# Patient Record
Sex: Male | Born: 1985 | Hispanic: Yes | Marital: Single | Smoking: Former smoker
Health system: Southern US, Community
[De-identification: ages and names within clinical notes are randomized; demographics above are authoritative.]

## PROBLEM LIST (undated history)

## (undated) DIAGNOSIS — E119 Type 2 diabetes mellitus without complications: Secondary | ICD-10-CM

---

## 2013-03-24 ENCOUNTER — Emergency Department (HOSPITAL_COMMUNITY): Payer: Self-pay

## 2013-03-24 ENCOUNTER — Encounter (HOSPITAL_COMMUNITY): Payer: Self-pay | Admitting: Emergency Medicine

## 2013-03-24 ENCOUNTER — Emergency Department (HOSPITAL_COMMUNITY)
Admission: EM | Admit: 2013-03-24 | Discharge: 2013-03-24 | Disposition: A | Discharge status: Home / Self Care | Payer: Self-pay | Attending: Emergency Medicine | Admitting: Emergency Medicine

## 2013-03-24 DIAGNOSIS — J069 Acute upper respiratory infection, unspecified: Secondary | ICD-10-CM | POA: Insufficient documentation

## 2013-03-24 DIAGNOSIS — H53149 Visual discomfort, unspecified: Secondary | ICD-10-CM | POA: Insufficient documentation

## 2013-03-24 DIAGNOSIS — R51 Headache: Secondary | ICD-10-CM

## 2013-03-24 DIAGNOSIS — R519 Headache, unspecified: Secondary | ICD-10-CM

## 2013-03-24 MED ORDER — IBUPROFEN 800 MG PO TABS
800.0000 mg | ORAL_TABLET | Freq: Once | ORAL | Status: AC
  Administered 2013-03-24: 800 mg via ORAL
  Filled 2013-03-24: qty 1

## 2013-03-24 MED ORDER — ACETAMINOPHEN 500 MG PO TABS
1000.0000 mg | ORAL_TABLET | Freq: Once | ORAL | Status: AC
  Administered 2013-03-24: 1000 mg via ORAL
  Filled 2013-03-24: qty 2

## 2013-03-24 MED ORDER — HYDROCODONE-ACETAMINOPHEN 5-325 MG PO TABS: 1.0000 | ORAL_TABLET | ORAL | Status: DC | PRN

## 2013-03-24 NOTE — Discharge Instructions (Signed)
Your temperature was mildly elevated at 100.1 to 100.6 Youras you in a you in a CT scan was negative for any stroke, bleed, tumor, oriented in no acute problems.  Please see the physicians at the adult medicine clinic here in [place] area if not improving, or return to the ED.  Please use ibuprofen every 6 hours. Please use norco for headache pain.

## 2013-03-24 NOTE — ED Notes (Signed)
Pt says headache pain is "the same". Alert, NAD at present.

## 2013-03-24 NOTE — ED Notes (Signed)
Pain rt post area of head radiating up and into rt periorbital area. For 9 days, No recent injury.  Pt says poor vision rt eye for "Years".  Rt eye diviates sl medially. Marland Kitchen.   MAE alert,

## 2013-03-24 NOTE — ED Notes (Signed)
Pt c/o headache x 10days and chills.

## 2013-03-24 NOTE — ED Provider Notes (Signed)
CSN: [id number]     Arrival date & time 03/24/13  1110 History   First MD Initiated Contact with Patient 03/24/13 1237     Chief Complaint  Patient presents with  . Headache     (Consider location/radiation/quality/duration/timing/severity/associated sxs/prior Treatment) Patient is a 28 y.o. male presenting with headaches. The history is provided by the patient.  Headache Pain location:  Occipital Radiates to: over the right eye. Severity currently:  7/10 Onset quality:  Gradual Duration:  9 days Timing:  Intermittent Progression:  Worsening Chronicity:  New Context: bright light   Context: not eating and not loud noise   Relieved by:  Nothing Worsened by:  Nothing tried Associated symptoms: photophobia   Associated symptoms: no abdominal pain, no back pain, no cough, no dizziness, no fever, no neck pain, no neck stiffness, no numbness, no paresthesias and no seizures   Risk factors: no family hx of SAH     History reviewed. No pertinent past medical history. History reviewed. No pertinent past surgical history. No family history on file. History  Substance Use Topics  . Smoking status: Never Smoker   . Smokeless tobacco: Not on file  . Alcohol Use: No    Review of Systems  Constitutional: Negative for fever and activity change.       All ROS Neg except as noted in HPI  HENT: Negative for nosebleeds.   Eyes: Positive for photophobia. Negative for discharge.  Respiratory: Negative for cough, shortness of breath and wheezing.   Cardiovascular: Negative for chest pain and palpitations.  Gastrointestinal: Negative for abdominal pain and blood in stool.  Genitourinary: Negative for dysuria, frequency and hematuria.  Musculoskeletal: Negative for arthralgias, back pain, neck pain and neck stiffness.  Skin: Negative.   Neurological: Positive for headaches. Negative for dizziness, seizures, speech difficulty, numbness and paresthesias.  Psychiatric/Behavioral: Negative for  hallucinations and confusion.      Allergies  Review of patient's allergies indicates no known allergies.  Home Medications  No current outpatient prescriptions on file. BP 136/74  Pulse 83  Temp(Src) 100.1 F (37.8 C) (Oral)  Ht 5\' 9"  (1.753 m)  Wt 212 lb (96.163 kg)  BMI 31.29 kg/m2  SpO2 97% Physical Exam  Nursing note and vitals reviewed. Constitutional: He is oriented to person, place, and time. He appears well-developed and well-nourished.  Non-toxic appearance.  HENT:  Head: Normocephalic.  Right Ear: Tympanic membrane and external ear normal.  Left Ear: Tympanic membrane and external ear normal.  No facial swelling, or redness over the sinuses.  Eyes: EOM and lids are normal. Pupils are equal, round, and reactive to light.  Neck: Normal range of motion. Neck supple. Carotid bruit is not present.  Cardiovascular: Normal rate, regular rhythm, normal heart sounds, intact distal pulses and normal pulses.   Pulmonary/Chest: Breath sounds normal. No respiratory distress.  Abdominal: Soft. Bowel sounds are normal. There is no tenderness. There is no guarding.  Musculoskeletal: Normal range of motion.  Lymphadenopathy:       Head (right side): No submandibular adenopathy present.       Head (left side): No submandibular adenopathy present.    He has no cervical adenopathy.  Neurological: He is alert and oriented to person, place, and time. He has normal strength. No cranial nerve deficit or sensory deficit. He exhibits normal muscle tone. Coordination normal.  Skin: Skin is warm and dry.  Psychiatric: He has a normal mood and affect. His speech is normal.    ED Course  Procedures (including critical care time) Labs Review Labs Reviewed - No data to display Imaging Review Ct Head Wo Contrast  03/24/2013   CLINICAL DATA:  Headache  EXAM: CT HEAD WITHOUT CONTRAST  TECHNIQUE: Contiguous axial images were obtained from the base of the skull through the vertex without  intravenous contrast.  COMPARISON:  None.  FINDINGS: No acute intracranial abnormality. Specifically, no hemorrhage, hydrocephalus, mass lesion, acute infarction, or significant intracranial injury. No acute calvarial abnormality. The visualized paranasal sinuses and mastoid air cells are patent.  IMPRESSION: No acute intracranial abnormality.   Electronically Signed   By: Salome HolmesHector  Cooper M.D.   On: 03/24/2013 13:45    EKG Interpretation   None       MDM   Final diagnoses:  None    **I have reviewed nursing notes, vital signs, and all appropriate lab and imaging results for this patient.*  Temp. 100.1. Vital signs otherwise well within normal limits. Pulse oximetry 97% on room air. CT head scan is negative for any acute changes. No gross neurologic deficits appreciated on examination.  Suspect patient has a viral illness that may be contributing to. Patient is advised to follow with his primary physician or the adult medicine clinic here in [place] area. He is to return to the emergency department if any acute changes or problems.   Kathie DikeHobson M Alverta Caccamo, PA-C 03/24/13 1436

## 2013-03-25 NOTE — ED Provider Notes (Signed)
Medical screening examination/treatment/procedure(s) were performed by non-physician practitioner and as supervising physician I was immediately available for consultation/collaboration.  EKG Interpretation   None         Nykiah Ma M Bradan Congrove, DO 03/25/13 0815 

## 2015-03-23 ENCOUNTER — Emergency Department (HOSPITAL_COMMUNITY)
Admission: EM | Admit: 2015-03-23 | Discharge: 2015-03-23 | Disposition: A | Discharge status: Home / Self Care | Payer: Self-pay | Attending: Emergency Medicine | Admitting: Emergency Medicine

## 2015-03-23 ENCOUNTER — Emergency Department (HOSPITAL_COMMUNITY): Payer: Self-pay

## 2015-03-23 ENCOUNTER — Encounter (HOSPITAL_COMMUNITY): Payer: Self-pay | Admitting: *Deleted

## 2015-03-23 DIAGNOSIS — R112 Nausea with vomiting, unspecified: Secondary | ICD-10-CM | POA: Insufficient documentation

## 2015-03-23 DIAGNOSIS — F17210 Nicotine dependence, cigarettes, uncomplicated: Secondary | ICD-10-CM | POA: Insufficient documentation

## 2015-03-23 DIAGNOSIS — R42 Dizziness and giddiness: Secondary | ICD-10-CM | POA: Insufficient documentation

## 2015-03-23 DIAGNOSIS — T59811A Toxic effect of smoke, accidental (unintentional), initial encounter: Secondary | ICD-10-CM

## 2015-03-23 DIAGNOSIS — J705 Respiratory conditions due to smoke inhalation: Secondary | ICD-10-CM | POA: Insufficient documentation

## 2015-03-23 MED ORDER — ONDANSETRON 4 MG PO TBDP
4.0000 mg | ORAL_TABLET | Freq: Once | ORAL | Status: AC
  Administered 2015-03-23: 4 mg via ORAL
  Filled 2015-03-23: qty 1
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
## 2015-03-23 NOTE — Discharge Instructions (Signed)
Lesin por la inhalacin de sustancias qumicas (Chemical Inhalation Injury) Una lesin por la inhalacin de sustancias qumicas es una lesin interna, por ejemplo, dao pulmonar, que se produce al [employer] gases de una sustancia qumica o daina (sustancia txica). Las lesiones por la inhalacin de sustancias qumicas ocurren con mayor frecuencia en estos casos:  Durante los incendios, cuando los materiales que se queman liberan sustancias qumicas en el [place].  Durante los accidentes laborales, cuando se derraman grandes cantidades de sustancias qumicas txicas en las fbricas o las industrias. Las lesiones por la inhalacin de sustancias qumicas son de diferente gravedad. Una lesin suele ser ms grave en los siguientes casos:  Cuanto ms cida o alcalina es la sustancia qumica.  Cuanto ms concentrada es la sustancia.  Cuanto ms tiempo se est expuesto a la sustancia. FACTORES DE RIESGO Tiene un riesgo alto de sufrir una lesin por la inhalacin de sustancias qumicas si:  Est expuesto a materiales que estn ardiendo.  Trabaja con sustancias qumicas, solventes o agentes de limpieza. SIGNOS Y SNTOMAS Los sntomas de una lesin por la inhalacin de sustancias qumicas pueden incluir lo siguiente:  Voz ronca.  Falta de aire o dificultad para respirar.  Dolor en el pecho.  Piel plida o de color azulado.  Produccin de mucosidad.  Tos.  Debilidad.  Mareos o [employer]. DIAGNSTICO La mayora de las lesiones por la inhalacin de sustancias txicas se puede diagnosticar con un examen fsico y Neomia Dear historia clnica. Se pueden hacer estudios para comprobar si hay dao pulmonar. Estos pueden incluir los siguientes:  Un anlisis del nivel de oxgeno en la [place].  Radiografa de trax.  Pruebas de la funcin pulmonar. No hay pruebas que permitan identificar la sustancia qumica especfica que caus la lesin. TRATAMIENTO  No hay un tratamiento especfico para una  lesin por la inhalacin de sustancias qumicas. La mayora de los tratamientos estn dirigidos a Scientist, clinical (histocompatibility and immunogenetics) la capacidad de los pulmones de llevar oxgeno al organismo. Se necesita tiempo para que el tejido pulmonar cicatrice. El tratamiento complementario puede incluir lo siguiente:  Tratamientos con [employer] para reducir la hinchazn de las vas respiratorias.  Succin de las vas respiratorias para retirar el exceso de mucosidad.  Oxgeno complementario. INSTRUCCIONES PARA EL CUIDADO EN EL HOGAR  No consuma ningn producto que contenga tabaco, lo que incluye cigarrillos, tabaco de Theatre manager o Administrator, Civil Service. Si necesita ayuda para dejar de fumar, consulte al mdico.  No se exponga a ningn producto que irrite las vas respiratorias, como el humo del cigarrillo o el humo de una chimenea.  Siga las indicaciones de su mdico en cuanto al uso de Conservation officer, nature.  Tome los medicamentos solamente como se lo haya indicado el mdico.  Concurra a todas las visitas de control como se lo haya indicado el mdico. Esto es importante. SOLICITE ATENCIN MDICA SI:  Los sntomas no mejoran como el mdico predijo. SOLICITE ATENCIN MDICA DE INMEDIATO SI:  Los sntomas empeoran.  Siente que aumentan la falta de aire o las sibilancias.  La piel o los labios estn muy plidos o de color azul.  Tiene tos persistente.  Tose con Montez Hageman o expectora un material oscuro.  Tiene dolor de pecho o debilidad.  Tiene fiebre.  Se desmaya.   Esta informacin no tiene Theme park manager el consejo del mdico. Asegrese de hacerle al mdico cualquier pregunta que tenga.   Document Released: 11/11/2013 Elsevier Interactive Patient Education [employer]
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
## 2015-11-22 ENCOUNTER — Emergency Department (HOSPITAL_COMMUNITY)
Admission: EM | Admit: 2015-11-22 | Discharge: 2015-11-22 | Disposition: A | Discharge status: Home / Self Care | Payer: Self-pay | Attending: Emergency Medicine | Admitting: Emergency Medicine

## 2015-11-22 ENCOUNTER — Encounter (HOSPITAL_COMMUNITY): Payer: Self-pay | Admitting: Emergency Medicine

## 2015-11-22 DIAGNOSIS — R300 Dysuria: Secondary | ICD-10-CM | POA: Insufficient documentation

## 2015-11-22 DIAGNOSIS — K219 Gastro-esophageal reflux disease without esophagitis: Secondary | ICD-10-CM | POA: Insufficient documentation

## 2015-11-22 DIAGNOSIS — Z87891 Personal history of nicotine dependence: Secondary | ICD-10-CM | POA: Insufficient documentation

## 2015-11-22 LAB — URINE MICROSCOPIC-ADD ON
BACTERIA UA: NONE SEEN
SQUAMOUS EPITHELIAL / LPF: NONE SEEN

## 2015-11-22 LAB — URINALYSIS, ROUTINE W REFLEX MICROSCOPIC
BILIRUBIN URINE: NEGATIVE
Glucose, UA: NEGATIVE mg/dL
Ketones, ur: NEGATIVE mg/dL
Leukocytes, UA: NEGATIVE
Nitrite: NEGATIVE
PH: 6 (ref 5.0–8.0)
SPECIFIC GRAVITY, URINE: 1.02 (ref 1.005–1.030)

## 2015-11-22 MED ORDER — AZITHROMYCIN 250 MG PO TABS
1000.0000 mg | ORAL_TABLET | Freq: Once | ORAL | Status: AC
  Administered 2015-11-22: 1000 mg via ORAL
  Filled 2015-11-22: qty 4

## 2015-11-22 MED ORDER — FAMOTIDINE 20 MG PO TABS: 20.0000 mg | ORAL_TABLET | Freq: Two times a day (BID) | ORAL | 0 refills | Status: DC

## 2015-11-22 MED ORDER — CEFTRIAXONE SODIUM 250 MG IJ SOLR
250.0000 mg | Freq: Once | INTRAMUSCULAR | Status: AC
  Administered 2015-11-22: 250 mg via INTRAMUSCULAR
  Filled 2015-11-22: qty 250
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
## 2015-11-22 NOTE — Discharge Instructions (Signed)
Call the clinic listed to establish primary care.  Return here for any worsening symptoms

## 2015-11-23 LAB — RPR: RPR Ser Ql: NONREACTIVE

## 2015-11-23 LAB — GC/CHLAMYDIA PROBE AMP (~~LOC~~) NOT AT ARMC
Chlamydia: NEGATIVE
Neisseria Gonorrhea: NEGATIVE

## 2015-11-23 LAB — HIV ANTIBODY (ROUTINE TESTING W REFLEX): HIV SCREEN 4TH GENERATION: NONREACTIVE

## 2015-11-24 ENCOUNTER — Encounter (HOSPITAL_COMMUNITY): Payer: Self-pay | Admitting: *Deleted

## 2015-11-24 ENCOUNTER — Emergency Department (HOSPITAL_COMMUNITY)
Admission: EM | Admit: 2015-11-24 | Discharge: 2015-11-24 | Disposition: A | Discharge status: Home / Self Care | Payer: Self-pay | Attending: Emergency Medicine | Admitting: Emergency Medicine

## 2015-11-24 DIAGNOSIS — R739 Hyperglycemia, unspecified: Secondary | ICD-10-CM | POA: Insufficient documentation

## 2015-11-24 DIAGNOSIS — R42 Dizziness and giddiness: Secondary | ICD-10-CM

## 2015-11-24 DIAGNOSIS — Z87891 Personal history of nicotine dependence: Secondary | ICD-10-CM | POA: Insufficient documentation

## 2015-11-24 LAB — I-STAT CHEM 8, ED
BUN: 13 mg/dL (ref 6–20)
CREATININE: 0.9 mg/dL (ref 0.61–1.24)
Calcium, Ion: 1.18 mmol/L (ref 1.15–1.40)
Chloride: 101 mmol/L (ref 101–111)
Glucose, Bld: 131 mg/dL — ABNORMAL HIGH (ref 65–99)
HCT: 48 % (ref 39.0–52.0)
Hemoglobin: 16.3 g/dL (ref 13.0–17.0)
POTASSIUM: 3.8 mmol/L (ref 3.5–5.1)
Sodium: 140 mmol/L (ref 135–145)
TCO2: 27 mmol/L (ref 0–100)

## 2015-11-24 LAB — URINALYSIS, ROUTINE W REFLEX MICROSCOPIC
Bilirubin Urine: NEGATIVE
Glucose, UA: NEGATIVE mg/dL
Ketones, ur: NEGATIVE mg/dL
Leukocytes, UA: NEGATIVE
Nitrite: NEGATIVE
Protein, ur: NEGATIVE mg/dL
SPECIFIC GRAVITY, URINE: 1.01 (ref 1.005–1.030)
pH: 6 (ref 5.0–8.0)

## 2015-11-24 LAB — URINE CULTURE: Culture: NO GROWTH

## 2015-11-24 LAB — URINE MICROSCOPIC-ADD ON
BACTERIA UA: NONE SEEN
RBC / HPF: NONE SEEN RBC/hpf (ref 0–5)
SQUAMOUS EPITHELIAL / LPF: NONE SEEN
WBC, UA: NONE SEEN WBC/hpf (ref 0–5)

## 2015-11-24 NOTE — ED Provider Notes (Signed)
[facility]-EMERGENCY DEPT Provider Note   CSN: [id number] Arrival date & time: 11/24/15  1357     History   Chief Complaint Chief Complaint  Patient presents with  . Dizziness    HPI Bhavya Grand is a 30 y.o. male.complaining vertigo for 3 weeks began gradually and occurs constantly.  Worsens with head movements.  States also more tired than usual and thinks he needs some vitamins.  HPI none History reviewed. No pertinent past medical history.  There are no active problems to display for this patient.   History reviewed. No pertinent surgical history.     Home Medications    Prior to Admission medications   Medication Sig Start Date End Date Taking? Authorizing Provider  famotidine (PEPCID) 20 MG tablet Take 1 tablet (20 mg total) by mouth 2 (two) times daily. 11/22/15   Tammy Triplett, PA-C  HYDROcodone-acetaminophen (NORCO) 5-325 MG per tablet Take 1 tablet by mouth every 4 (four) hours as needed for moderate pain. 03/24/13   Ivery Quale, PA-C    Family History No family history on file.  Social History Social History  Substance Use Topics  . Smoking status: Former Games developer  . Smokeless tobacco: Never Used  . Alcohol use No     Allergies   Review of patient's allergies indicates no known allergies.   Review of Systems Review of Systems  Constitutional: Positive for fatigue. Negative for appetite change, chills, diaphoresis and unexpected weight change.  HENT: Positive for hearing loss. Negative for congestion, dental problem, ear discharge, ear pain, facial swelling, mouth sores, nosebleeds, postnasal drip, rhinorrhea, sinus pressure, sneezing, sore throat, tinnitus, trouble swallowing and voice change.        Ears get stopped up occasionally  Eyes: Negative.   Respiratory: Negative.   Cardiovascular: Negative.   Gastrointestinal: Negative.   Endocrine: Positive for polydipsia and polyuria.  Genitourinary: Positive for frequency. Negative for decreased  urine volume, discharge, flank pain, hematuria, penile pain, penile swelling, scrotal swelling, testicular pain and urgency.  Musculoskeletal: Positive for back pain. Negative for gait problem, joint swelling, myalgias, neck pain and neck stiffness.  Skin: Negative.   Allergic/Immunologic: Negative.   Neurological: Negative.   Hematological: Negative.   Psychiatric/Behavioral: Negative.   All other systems reviewed and are negative.    Physical Exam Updated Vital Signs BP 137/75 (BP Location: Left Arm)   Pulse 78   Temp 98.7 F (37.1 C) (Temporal)   Resp 16   Wt 101.6 kg   SpO2 99%   BMI 33.08 kg/m   Physical Exam  Constitutional: He is oriented to person, place, and time. He appears well-developed and well-nourished.  HENT:  Head: Normocephalic and atraumatic.  Right Ear: External ear normal.  Left Ear: External ear normal.  Nose: Nose normal.  Mouth/Throat: Oropharynx is clear and moist.  Eyes: Conjunctivae and EOM are normal. Pupils are equal, round, and reactive to light.  Disconjugate gaze-baseline per patient  Neck: Normal range of motion. Neck supple.  Cardiovascular: Normal rate, regular rhythm, normal heart sounds and intact distal pulses.   Pulmonary/Chest: Effort normal and breath sounds normal. No respiratory distress. He has no wheezes. He exhibits no tenderness.  Abdominal: Soft. Bowel sounds are normal. He exhibits no distension and no mass. There is no tenderness. There is no guarding.  Musculoskeletal: Normal range of motion.  Neurological: He is alert and oriented to person, place, and time. He has normal reflexes. He displays normal reflexes. No cranial nerve deficit. He exhibits normal muscle tone.  Coordination normal.  Skin: Skin is warm and dry. Capillary refill takes less than 2 seconds.  Psychiatric: He has a normal mood and affect. His behavior is normal. Judgment and thought content normal.  Nursing note and vitals reviewed.    ED Treatments /  Results  Labs (all labs ordered are listed, but only abnormal results are displayed) Labs Reviewed  URINALYSIS, ROUTINE W REFLEX MICROSCOPIC (NOT AT [facility]) - Abnormal; Notable for the following:       Result Value   Hgb urine dipstick TRACE (*)    All other components within normal limits  I-STAT CHEM 8, ED - Abnormal; Notable for the following:    Glucose, Bld 131 (*)    All other components within normal limits  URINE MICROSCOPIC-ADD ON    EKG  EKG Interpretation None       Radiology No results found.  Procedures Procedures (including critical care time)  Medications Ordered in ED Medications - No data to display   Initial Impression / Assessment and Plan / ED Course  I have reviewed the triage vital signs and the nursing notes.  Pertinent labs & imaging results that were available during my care of the patient were reviewed by me and considered in my medical decision making (see chart for details).  Clinical Course    Patient stable here.  Exam including neuro normal.  Blood sugar elevated at 130.  Discussed need for followup with patient and voices understanding.   Final Clinical Impressions(s) / ED Diagnoses   Final diagnoses:  Dizziness  Hyperglycemia    New Prescriptions New Prescriptions   No medications on file     Margarita Grizzleanielle Katielynn Horan, MD 11/24/15 1649

## 2015-11-24 NOTE — ED Triage Notes (Signed)
C/o dizziness, lack of energy seen  2 days ago for same, states he is worse

## 2015-11-25 NOTE — ED Provider Notes (Signed)
[facility]-EMERGENCY DEPT Provider Note   CSN: [id number] Arrival date & time: 11/22/15  1130     History   Chief Complaint Chief Complaint  Patient presents with  . Dysuria    HPI Luis Becker is a 30 y.o. male.  HPI  Luis Becker is a 30 y.o. male who presents to the Emergency Department complaining of burning with urination for 2 weeks.  He describes the burning as intermittent.  States that he is currently not sexually active.  He also complains of burning pain to his chest and abdominal bloating both associated with food intake.  He denies abdominal pain, vomiting, shortness of breath, genital lesions or penile discharge.   History reviewed. No pertinent past medical history.  There are no active problems to display for this patient.   History reviewed. No pertinent surgical history.     Home Medications    Prior to Admission medications   Medication Sig Start Date End Date Taking? Authorizing Provider  famotidine (PEPCID) 20 MG tablet Take 1 tablet (20 mg total) by mouth 2 (two) times daily. 11/22/15   Santita Hunsberger, PA-C    Family History History reviewed. No pertinent family history.  Social History Social History  Substance Use Topics  . Smoking status: Former Games developer  . Smokeless tobacco: Never Used  . Alcohol use No     Allergies   Review of patient's allergies indicates no known allergies.   Review of Systems Review of Systems  Constitutional: Negative for activity change, appetite change, chills and fever.  Respiratory: Negative for chest tightness and shortness of breath.   Gastrointestinal: Negative for abdominal pain, nausea and vomiting.       Abdominal bloating  Genitourinary: Positive for dysuria. Negative for decreased urine volume, difficulty urinating, discharge, flank pain, frequency, genital sores, hematuria, penile swelling, scrotal swelling, testicular pain and urgency.  Musculoskeletal: Negative for back pain.  Skin: Negative for  rash.  Neurological: Negative for dizziness, weakness and numbness.  Hematological: Negative for adenopathy.  Psychiatric/Behavioral: Negative for confusion.  All other systems reviewed and are negative.    Physical Exam Updated Vital Signs BP 133/88 (BP Location: Left Arm)   Pulse 88   Temp 98.6 F (37 C) (Oral)   Resp 18   Ht 5\' 9"  (1.753 m)   Wt 90.7 kg   SpO2 98%   BMI 29.53 kg/m   Physical Exam  Constitutional: He is oriented to person, place, and time. He appears well-developed and well-nourished. No distress.  HENT:  Head: Normocephalic and atraumatic.  Mouth/Throat: Oropharynx is clear and moist.  Cardiovascular: Normal rate, regular rhythm, normal heart sounds and intact distal pulses.   No murmur heard. Pulmonary/Chest: Effort normal and breath sounds normal. No respiratory distress. He has no wheezes. He has no rales.  Abdominal: Soft. Normal appearance. He exhibits no distension and no mass. There is no hepatosplenomegaly. There is no tenderness. There is no rigidity, no rebound, no guarding, no CVA tenderness and no tenderness at McBurney's point. Hernia confirmed negative in the right inguinal area and confirmed negative in the left inguinal area.  Genitourinary: Cremasteric reflex is present. Right testis shows no mass and no tenderness. Left testis shows no mass and no tenderness. Uncircumcised. No phimosis, paraphimosis, penile erythema or penile tenderness. No discharge found.  Genitourinary Comments: Exam chaperoned by nursing staff. No penile discharge, genital lesions or balanitis.  Musculoskeletal: Normal range of motion. He exhibits no edema.  Neurological: He is alert and oriented to person, place,  and time. Coordination normal.  Skin: Skin is warm and dry. No rash noted.  Nursing note and vitals reviewed.    ED Treatments / Results  Labs (all labs ordered are listed, but only abnormal results are displayed) Labs Reviewed  URINALYSIS, ROUTINE W  REFLEX MICROSCOPIC (NOT AT [facility]) - Abnormal; Notable for the following:       Result Value   Hgb urine dipstick TRACE (*)    Protein, ur TRACE (*)    All other components within normal limits  URINE CULTURE  RPR  HIV ANTIBODY (ROUTINE TESTING)  URINE MICROSCOPIC-ADD ON  GC/CHLAMYDIA PROBE AMP ([place]) NOT AT [facility]    EKG  EKG Interpretation None       Radiology No results found.  Procedures Procedures (including critical care time)  Medications Ordered in ED Medications  cefTRIAXone (ROCEPHIN) injection 250 mg (250 mg Intramuscular Given 11/22/15 1356)  azithromycin (ZITHROMAX) tablet 1,000 mg (1,000 mg Oral Given 11/22/15 1357)     Initial Impression / Assessment and Plan / ED Course  I have reviewed the triage vital signs and the nursing notes.  Pertinent labs & imaging results that were available during my care of the patient were reviewed by me and considered in my medical decision making (see chart for details).  Clinical Course    Pt well appearing.  Vitals stable.  abd soft, NT.    IM rocephin and zithromax given, discussed possible GERD and PMD f/u.  Referral info given.  The patient appears reasonably screened and/or stabilized for discharge and I doubt any other medical condition or other [facility] requiring further screening, evaluation, or treatment in the ED at this time prior to discharge.   Final Clinical Impressions(s) / ED Diagnoses   Final diagnoses:  Dysuria  Gastroesophageal reflux disease without esophagitis    New Prescriptions Discharge Medication List as of 11/22/2015  2:05 PM    START taking these medications   Details  famotidine (PEPCID) 20 MG tablet Take 1 tablet (20 mg total) by mouth 2 (two) times daily., Starting Thu 11/22/2015, Print         Pauline Ausammy Sandria Mcenroe, PA-C 11/25/15 1133    Marily MemosJason Mesner, MD 11/26/15 [phone]

## 2016-01-22 ENCOUNTER — Encounter (HOSPITAL_COMMUNITY): Payer: Self-pay | Admitting: Emergency Medicine

## 2016-01-22 ENCOUNTER — Emergency Department (HOSPITAL_COMMUNITY)
Admission: EM | Admit: 2016-01-22 | Discharge: 2016-01-22 | Disposition: A | Discharge status: Home / Self Care | Payer: Self-pay | Attending: Emergency Medicine | Admitting: Emergency Medicine

## 2016-01-22 DIAGNOSIS — E119 Type 2 diabetes mellitus without complications: Secondary | ICD-10-CM | POA: Insufficient documentation

## 2016-01-22 DIAGNOSIS — Z87891 Personal history of nicotine dependence: Secondary | ICD-10-CM | POA: Insufficient documentation

## 2016-01-22 DIAGNOSIS — R002 Palpitations: Secondary | ICD-10-CM | POA: Insufficient documentation

## 2016-01-22 DIAGNOSIS — R631 Polydipsia: Secondary | ICD-10-CM | POA: Insufficient documentation

## 2016-01-22 HISTORY — DX: Type 2 diabetes mellitus without complications: E11.9

## 2016-01-22 LAB — I-STAT CHEM 8, ED
BUN: 35 mg/dL — ABNORMAL HIGH (ref 6–20)
Calcium, Ion: 1.03 mmol/L — ABNORMAL LOW (ref 1.15–1.40)
Chloride: 106 mmol/L (ref 101–111)
Creatinine, Ser: 1 mg/dL (ref 0.61–1.24)
Glucose, Bld: 101 mg/dL — ABNORMAL HIGH (ref 65–99)
HCT: 47 % (ref 39.0–52.0)
Hemoglobin: 16 g/dL (ref 13.0–17.0)
Potassium: 4.3 mmol/L (ref 3.5–5.1)
Sodium: 140 mmol/L (ref 135–145)
TCO2: 27 mmol/L (ref 0–100)

## 2016-01-22 NOTE — ED Triage Notes (Signed)
Pt reports feeling like his heart "is beating too fast." Pt states his diabetic medicine has been decreased.

## 2016-01-22 NOTE — ED Provider Notes (Signed)
[facility] Provider Note   CSN: [id number] Arrival date & time: 01/22/16  1646 By signing my name below, I, Luis Becker, attest that this documentation has been prepared under the direction and in the presence of Luis RazorStephen Josedaniel Haye, MD. Electronically Signed: Bridgette HabermannMaria Becker, ED Scribe. 01/22/16. 5:40 PM.  History   Chief Complaint Chief Complaint  Patient presents with  . Palpitations   HPI Comments: Luis BeltRoberto Becker is a 30 y.o. male with h/o DM who presents to the Emergency Department complaining of intermittent sensation of heart palpations onset 4 days ago. Pt states these episodes last about 1-2 minutes. He also notes that he has been increasingly thirsty lately. Pt reports that he is regularly on Metformin for his diabetes and his prescription has recently been decreased to one pill a day. He is concerned that this is what may be causing his symptoms. Pt drank a bottle of water and notes that it relieved his symptoms. Pt does not drink alcohol and does not drink caffeine. Denies drug use. Pt further denies chest pain, fever, chills, urinary frequency, or any other associated symptoms.  The history is provided by the patient. No language interpreter was used.    Past Medical History:  Diagnosis Date  . Diabetes mellitus without complication (HCC)    per pt pre diabetic    There are no active problems to display for this patient.   History reviewed. No pertinent surgical history.     Home Medications    Prior to Admission medications   Medication Sig Start Date End Date Taking? Authorizing Provider  famotidine (PEPCID) 20 MG tablet Take 1 tablet (20 mg total) by mouth 2 (two) times daily. 11/22/15   Tammy Triplett, PA-C    Family History History reviewed. No pertinent family history.  Social History Social History  Substance Use Topics  . Smoking status: Former Games developermoker  . Smokeless tobacco: Never Used  . Alcohol use No     Allergies   Patient has no known  allergies.   Review of Systems Review of Systems  Constitutional: Negative for chills and fever.  Cardiovascular: Positive for palpitations. Negative for chest pain.  Endocrine: Positive for polydipsia.  Genitourinary: Negative for frequency.  All other systems reviewed and are negative.    Physical Exam Updated Vital Signs BP 120/67 (BP Location: Right Arm)   Pulse 82   Temp 98.3 F (36.8 C) (Oral)   Resp 19   Ht 5' 9.29" (1.76 m)   Wt 203 lb (92.1 kg)   SpO2 100%   BMI 29.73 kg/m   Physical Exam  Constitutional: He is oriented to person, place, and time. Vital signs are normal. He appears well-developed and well-nourished.  Non-toxic appearance. He does not appear ill. No distress.  HENT:  Head: Normocephalic and atraumatic.  Nose: Nose normal.  Mouth/Throat: Oropharynx is clear and moist. No oropharyngeal exudate.  Eyes: Conjunctivae and EOM are normal. Pupils are equal, round, and reactive to light. No scleral icterus.  Neck: Normal range of motion. Neck supple. No tracheal deviation, no edema, no erythema and normal range of motion present. No thyroid mass and no thyromegaly present.  Cardiovascular: Normal rate, regular rhythm, S1 normal, S2 normal, normal heart sounds, intact distal pulses and normal pulses.  Exam reveals no gallop and no friction rub.   No murmur heard. Pulmonary/Chest: Effort normal and breath sounds normal. No respiratory distress. He has no wheezes. He has no rhonchi. He has no rales.  Abdominal: Soft. Normal appearance and  bowel sounds are normal. He exhibits no distension, no ascites and no mass. There is no hepatosplenomegaly. There is no tenderness. There is no rebound, no guarding and no CVA tenderness.  Musculoskeletal: Normal range of motion. He exhibits no edema or tenderness.  Lymphadenopathy:    He has no cervical adenopathy.  Neurological: He is alert and oriented to person, place, and time. He has normal strength. No cranial nerve  deficit or sensory deficit.  Skin: Skin is warm, dry and intact. No petechiae and no rash noted. He is not diaphoretic. No erythema. No pallor.  Nursing note and vitals reviewed.    ED Treatments / Results  DIAGNOSTIC STUDIES: Oxygen Saturation is 100% on RA, normal by my interpretation.    COORDINATION OF CARE: 5:32 PM Discussed treatment plan with pt at bedside which includes blood work and pt agreed to plan.  Labs (all labs ordered are listed, but only abnormal results are displayed) Labs Reviewed - No data to display  EKG  EKG Interpretation None       Radiology No results found.  Procedures Procedures (including critical care time)  Medications Ordered in ED Medications - No data to display   Initial Impression / Assessment and Plan / ED Course  I have reviewed the triage vital signs and the nursing notes.  Pertinent labs & imaging results that were available during my care of the patient were reviewed by me and considered in my medical decision making (see chart for details).  Clinical Course     30 year old male with palpitations.He remained in a sinus rhythm on the monitor. EKG is not concerning. Basic labs unremarkable. Doubt emergent condition.  Final Clinical Impressions(s) / ED Diagnoses   Final diagnoses:  Palpitations    New Prescriptions New Prescriptions   No medications on file   I personally preformed the services scribed in my presence. The recorded information has been reviewed is accurate. Luis RazorStephen Talis Iwan, MD.     Luis RazorStephen Harshil Cavallaro, MD 01/22/16 [phone]

## 2016-01-22 NOTE — ED Notes (Addendum)
Attempted to obtain I-stat Chem 8. Site infiltrated before blood sample could be obtained. Consulting civil engineerCharge RN and primary RN aware.

## 2017-10-07 ENCOUNTER — Encounter (HOSPITAL_COMMUNITY): Payer: Self-pay

## 2017-10-07 ENCOUNTER — Emergency Department (HOSPITAL_COMMUNITY): Payer: Self-pay

## 2017-10-07 ENCOUNTER — Other Ambulatory Visit: Payer: Self-pay

## 2017-10-07 ENCOUNTER — Emergency Department (HOSPITAL_COMMUNITY)
Admission: EM | Admit: 2017-10-07 | Discharge: 2017-10-07 | Disposition: A | Discharge status: Home / Self Care | Payer: Self-pay | Attending: Emergency Medicine | Admitting: Emergency Medicine

## 2017-10-07 DIAGNOSIS — E119 Type 2 diabetes mellitus without complications: Secondary | ICD-10-CM | POA: Insufficient documentation

## 2017-10-07 DIAGNOSIS — M5431 Sciatica, right side: Secondary | ICD-10-CM | POA: Insufficient documentation

## 2017-10-07 DIAGNOSIS — Z87891 Personal history of nicotine dependence: Secondary | ICD-10-CM | POA: Insufficient documentation

## 2017-10-07 MED ORDER — CYCLOBENZAPRINE HCL 5 MG PO TABS: 5.0000 mg | ORAL_TABLET | Freq: Three times a day (TID) | ORAL | 0 refills | Status: DC | PRN

## 2017-10-07 MED ORDER — IBUPROFEN 800 MG PO TABS: 800.0000 mg | ORAL_TABLET | Freq: Three times a day (TID) | ORAL | 0 refills | Status: DC

## 2017-10-07 NOTE — ED Notes (Signed)
Registration in room with patient.

## 2017-10-07 NOTE — ED Provider Notes (Signed)
[facility] EMERGENCY DEPARTMENT Provider Note   CSN: [id number] Arrival date & time: 10/07/17  1343     History   Chief Complaint Chief Complaint  Patient presents with  . Back Pain    HPI Luis Becker is a 32 y.o. male with no significant past problems with his lower back presenting with a 4 day history of low back pain with intermittent radiating burning pain to his lateral right mid thigh. He denies injury. 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  He denies weakness in his legs or feet and denies urinary or fecal incontinence or retention, no dysuria and no flank pain.  He also denies fevers, chills, rash.  He has taken naproxen without significant improvement in his symptoms. Pain is worsen with movement, especially with standing from a seated position.  The history is provided by the patient.    Past Medical History:  Diagnosis Date  . Diabetes mellitus without complication (HCC)    per pt pre diabetic    There are no active problems to display for this patient.   History reviewed. No pertinent surgical history.      Home Medications    Prior to Admission medications   Medication Sig Start Date End Date Taking? Authorizing Provider  cyclobenzaprine (FLEXERIL) 5 MG tablet Take 1 tablet (5 mg total) by mouth 3 (three) times daily as needed for muscle spasms. 10/07/17   Burgess Amor, PA-C  famotidine (PEPCID) 20 MG tablet Take 1 tablet (20 mg total) by mouth 2 (two) times daily. Patient not taking: Reported on 01/22/2016 11/22/15   Triplett, Babette Relic, PA-C  ibuprofen (ADVIL,MOTRIN) 800 MG tablet Take 1 tablet (800 mg total) by mouth 3 (three) times daily. 10/07/17   Burgess Amor, PA-C    Family History No family history on file.  Social History Social History   Tobacco Use  . Smoking status: Former Games developer  . Smokeless tobacco: Never Used  Substance Use Topics  . Alcohol use: No  . Drug use: No      Allergies   Patient has no known allergies.   Review of Systems Review of Systems  Constitutional: Negative for fever.  Respiratory: Negative for shortness of breath.   Cardiovascular: Negative for chest pain and leg swelling.  Gastrointestinal: Negative for abdominal distention, abdominal pain and constipation.  Genitourinary: Negative for difficulty urinating, dysuria, flank pain, frequency and urgency.  Musculoskeletal: Positive for back pain. Negative for gait problem and joint swelling.  Skin: Negative for rash.  Neurological: Negative for weakness and numbness.     Physical Exam Updated Vital Signs BP 132/79 (BP Location: Left Arm)   Pulse 68   Temp 100 F (37.8 C) (Oral)   Resp 16   Wt 101.6 kg   SpO2 96%   BMI 32.80 kg/m   Physical Exam  Constitutional: He appears well-developed and well-nourished.  HENT:  Head: Normocephalic.  Eyes: Conjunctivae are normal.  Neck: Normal range of motion. Neck supple.  Cardiovascular: Normal rate and intact distal pulses.  Pedal pulses normal.  Pulmonary/Chest: Effort normal.  Abdominal: Soft. Bowel sounds are normal. He exhibits no distension and no mass.  Musculoskeletal: Normal range of motion. He exhibits no edema.       Lumbar back: He exhibits tenderness. He exhibits no bony tenderness, no swelling, no edema and no spasm.  ttp right paralumbar soft tissue. No midline pain or edema.  Neurological: He is alert. He  has normal strength. He displays no atrophy and no tremor. No sensory deficit. Gait normal.  Reflex Scores:      Patellar reflexes are 2+ on the right side and 2+ on the left side. No strength deficit noted in hip and knee flexor and extensor muscle groups.  Ankle flexion and extension intact. Gait normal.  Skin: Skin is warm and dry.  Psychiatric: He has a normal mood and affect.  Nursing note and vitals reviewed.    ED Treatments / Results  Labs (all labs ordered are listed, but only abnormal  results are displayed) Labs Reviewed - No data to display  EKG None  Radiology Dg Lumbar Spine Complete  Result Date: 10/07/2017 CLINICAL DATA:  Low back pain for the past several days. EXAM: LUMBAR SPINE - COMPLETE 4+ VIEW COMPARISON:  None. FINDINGS: There are 5 non rib-bearing lumbar type vertebral bodies. There is a mild (under 5 degrees) scoliotic curvature of the thoracolumbar spine with dominant caudal component convex the left, potentially positional. There is minimal straightening of the expected lumbar lordosis. No anterolisthesis or retrolisthesis. No definite pars defects. Lumbar vertebral body heights appear preserved. Lumbar intervertebral disc space heights appear preserved. Limited visualization of the bilateral SI joints and hips is normal. Regional soft tissues are normal. IMPRESSION: Minimal straightening of the expected lumbar lordosis, nonspecific though could be seen in the setting of muscle spasm. Otherwise, no explanation for patient's low back pain. Electronically Signed   By: Simonne ComeJohn  Watts M.D.   On: 10/07/2017 15:02    Procedures Procedures (including critical care time)  Medications Ordered in ED Medications - No data to display   Initial Impression / Assessment and Plan / ED Course  I have reviewed the triage vital signs and the nursing notes.  Pertinent labs & imaging results that were available during my care of the patient were reviewed by me and considered in my medical decision making (see chart for details).     Imaging reviewed and discussed. Suspect muscle strain/spasm as source of sx.  Discussed heat and ice tx, advised activities as tolerated, flexeril and ibuprofen prescribed. Referral to several clinics for pcp f/u, return precautions outlined.  Final Clinical Impressions(s) / ED Diagnoses   Final diagnoses:  Sciatica of right side    ED Discharge Orders         Ordered    cyclobenzaprine (FLEXERIL) 5 MG tablet  3 times daily PRN      10/07/17 1552    ibuprofen (ADVIL,MOTRIN) 800 MG tablet  3 times daily     10/07/17 1552           Burgess Amordol, Moises Terpstra, PA-C 10/07/17 2102    Mesner, Barbara CowerJason, MD 10/09/17 2359

## 2017-10-07 NOTE — ED Notes (Signed)
Patient reports lower lumbar pain that increases with bending, twisting, or lifting. No acute injury. 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 Occas. radiation to L thigh. No problems with bowel or bladder.

## 2017-10-07 NOTE — ED Triage Notes (Signed)
Pt states lower back pain for the last few days. Upon trying to get out of bed today he felt more intense pain. Is ambulatory. NAD

## 2017-10-07 NOTE — Discharge Instructions (Signed)
Take your next dose of prednisone tomorrow morning.  Use the the other medicines as directed.  Do not drive within 4 hours of taking flexeril as this can make you drowsy.  Avoid lifting,  Bending,  Twisting or any other activity that worsens your pain over the next week.  Apply a heating pad to your lower back for 20 minutes 3-4 times daily.  You should get rechecked if your symptoms are not better over the next 5 days,  Or you develop increased pain,  Weakness in your leg(s) or loss of bladder or bowel function - these are symptoms of a worse injury.  Your xrays suggest that you may have muscle spasm in your lower back as the cause of your symptoms.  Bend your legs at the hips when sitting and lying down to take the pressure off your lower back as much as possible as we discussed.  Avoid any activities that worsens your pain until resolved.

## 2018-01-23 ENCOUNTER — Emergency Department (HOSPITAL_COMMUNITY)
Admission: EM | Admit: 2018-01-23 | Discharge: 2018-01-23 | Disposition: A | Discharge status: Home / Self Care | Payer: Self-pay | Attending: Emergency Medicine | Admitting: Emergency Medicine

## 2018-01-23 ENCOUNTER — Emergency Department (HOSPITAL_COMMUNITY): Payer: Self-pay

## 2018-01-23 ENCOUNTER — Encounter (HOSPITAL_COMMUNITY): Payer: Self-pay | Admitting: Emergency Medicine

## 2018-01-23 DIAGNOSIS — E119 Type 2 diabetes mellitus without complications: Secondary | ICD-10-CM | POA: Insufficient documentation

## 2018-01-23 DIAGNOSIS — M79675 Pain in left toe(s): Secondary | ICD-10-CM | POA: Insufficient documentation

## 2018-01-23 DIAGNOSIS — Z87891 Personal history of nicotine dependence: Secondary | ICD-10-CM | POA: Insufficient documentation

## 2018-01-23 MED ORDER — NAPROXEN 500 MG PO TABS: 500.0000 mg | ORAL_TABLET | Freq: Two times a day (BID) | ORAL | 0 refills | Status: AC

## 2018-01-23 NOTE — ED Provider Notes (Signed)
[facility] EMERGENCY DEPARTMENT Provider Note   CSN: [id number] Arrival date & time: 01/23/18  [id number]     History   Chief Complaint Chief Complaint  Patient presents with  . Foot Pain    HPI Luis Becker is a 32 y.o. male with no significant past medical history who presents for evaluation of left foot pain. Pain began 1 week ago. Pain is intermittent in nature. Pain is located to the lateral surface at the base of the great toe. Has been taking Naproxen with mild relief of symptoms. Last dose of Naproxen yesterday at 7pm. States only taking "1 dose" of naproxen once daily for symptom relief over the last week. Denies radiation of pain. Denies injury or trauma. Rates his pain a 4/10 currently, 7/10 at it worse. States nothing makes pain worse. Rest relieves pain. Denies fever, chills, N/V, swelling, ecchymosis, redness, warmth, numbness/tingling in extremities, weakness. Denies additional aggravating or alleviating factors other than stated above. Has been able to walk without difficulty. State he has had a similar episode previously around 5 years ago. States he was given an injection into his toe which resolved the pain. Denies IVDU, immunocompromised state.  History obtained from patient. No interpretor was used.  HPI  Past Medical History:  Diagnosis Date  . Diabetes mellitus without complication (HCC)    per pt pre diabetic    There are no active problems to display for this patient.   History reviewed. No pertinent surgical history.      Home Medications    Prior to Admission medications   Medication Sig Start Date End Date Taking? Authorizing Provider  cyclobenzaprine (FLEXERIL) 5 MG tablet Take 1 tablet (5 mg total) by mouth 3 (three) times daily as needed for muscle spasms. 10/07/17   Burgess AmorIdol, Julie, PA-C  famotidine (PEPCID) 20 MG tablet Take 1 tablet (20 mg total) by mouth 2 (two) times daily. Patient not taking: Reported on 01/22/2016 11/22/15   Triplett, Babette Relicammy,  PA-C  ibuprofen (ADVIL,MOTRIN) 800 MG tablet Take 1 tablet (800 mg total) by mouth 3 (three) times daily. 10/07/17   Burgess AmorIdol, Julie, PA-C  naproxen (NAPROSYN) 500 MG tablet Take 1 tablet (500 mg total) by mouth 2 (two) times daily for 7 days. 01/23/18 01/30/18  Rasheen Bells A, PA-C    Family History History reviewed. No pertinent family history.  Social History Social History   Tobacco Use  . Smoking status: Former Games developermoker  . Smokeless tobacco: Never Used  Substance Use Topics  . Alcohol use: No  . Drug use: No     Allergies   Patient has no known allergies.   Review of Systems Review of Systems  Constitutional: Negative.   Respiratory: Negative for cough and shortness of breath.   Cardiovascular: Negative for chest pain and leg swelling.  Gastrointestinal: Negative for nausea and vomiting.  Musculoskeletal: Negative for arthralgias, back pain, gait problem, joint swelling, myalgias, neck pain and neck stiffness.       Left great toe pain  Skin: Negative.   Neurological: Negative for dizziness, weakness, light-headedness, numbness and headaches.  All other systems reviewed and are negative.    Physical Exam Updated Vital Signs BP 136/89 (BP Location: Right Arm)   Pulse 71   Temp 99.2 F (37.3 C) (Oral)   Resp 16   Ht 5' 8.9" (1.75 m)   Wt 101 kg   SpO2 97%   BMI 32.98 kg/m   Physical Exam Vitals signs and nursing note reviewed.  Constitutional:  General: He is not in acute distress.    Appearance: Normal appearance. He is well-developed. He is not ill-appearing, toxic-appearing or diaphoretic.  HENT:     Head: Atraumatic.  Eyes:     Pupils: Pupils are equal, round, and reactive to light.  Neck:     Musculoskeletal: Normal range of motion and neck supple.  Cardiovascular:     Rate and Rhythm: Normal rate and regular rhythm.     Pulses: Normal pulses.          Dorsalis pedis pulses are 2+ on the right side and 2+ on the left side.     Heart sounds:  Normal heart sounds.  Pulmonary:     Effort: Pulmonary effort is normal. No respiratory distress.     Breath sounds: Normal breath sounds. No stridor or decreased air movement. No decreased breath sounds, wheezing, rhonchi or rales.  Abdominal:     General: There is no distension.     Palpations: Abdomen is soft.  Musculoskeletal: Normal range of motion.     Right ankle: Normal.     Left ankle: Normal.     Right foot: Normal range of motion. No deformity, bunion or foot drop.     Left foot: Normal range of motion. No deformity, bunion or foot drop.     Comments: Patient able to ambulate without difficulty and without ataxic gait.  Mild tenderness palpation over left great toe.  No erythema or warmth.  There is no swelling.  No streaking.  No bony tenderness.  Able to flex and extend at metatarsals as well as plantarflex and dorsiflex at ankle.  Lower extremity compartments are soft. No calf tenderness, swelling or erythema. Full range of motion bilateral lower extremities. No midline back pain.  Feet:     Right foot:     Skin integrity: Skin integrity normal.     Toenail Condition: Right toenails are normal.     Left foot:     Skin integrity: Skin integrity normal.     Toenail Condition: Left toenails are normal.  Skin:    General: Skin is warm and dry.     Coloration: Skin is not jaundiced, mottled or pale.     Findings: No abrasion, abscess, bruising, burn, ecchymosis, erythema, signs of injury, laceration, lesion, rash or wound.     Comments: No rashes or lesions.  No ecchymosis.  No erythema, warmth.    Neurological:     General: No focal deficit present.     Mental Status: He is alert.     Sensory: Sensation is intact. No sensory deficit.     Motor: Motor function is intact. No weakness, tremor or abnormal muscle tone.     Gait: Gait is intact. Gait normal.     Comments: Intact sensation to sharp and dull bilateral lower extremity. 5/5 strength to bilateral lower extremities.         ED Treatments / Results  Labs (all labs ordered are listed, but only abnormal results are displayed) Labs Reviewed - No data to display  EKG None  Radiology Dg Foot Complete Left  Result Date: 01/23/2018 CLINICAL DATA:  Acute left foot pain without known injury. EXAM: LEFT FOOT - COMPLETE 3+ VIEW COMPARISON:  None. FINDINGS: There is no evidence of fracture or dislocation. There is no evidence of arthropathy or other focal bone abnormality. Soft tissues are unremarkable. IMPRESSION: Negative. Electronically Signed   By: Lupita Raider, M.D.   On: 01/23/2018 09:21  Procedures Procedures (including critical care time)  Medications Ordered in ED Medications - No data to display   Initial Impression / Assessment and Plan / ED Course  I have reviewed the triage vital signs and the nursing notes.  Pertinent labs & imaging results that were available during my care of the patient were reviewed by me and considered in my medical decision making (see chart for details).  32 year old male who appears otherwise well presents for evaluation of left great toe pain.  Onset 1 week ago.  Has taken naproxen with mild relief of symptoms for "1 dose."  Mild tenderness to palpation to left great toe.  No erythema, warmth, or edema.  No evidence of cellulitis.  Normal musculoskeletal exam.  Neurovascularly intact.  He is able to ambulate without difficulty.  Has had similar episode approximately 4 years ago and was given a steroid injection and his symptoms resolved.  Patient does not recall whether he was ever diagnosed with gout previously. Denies IV drug use or immunocompromised state. Denies trauma or recent injury.  Afebrile, nonseptic, non-ill-appearing. Will obtain plain film to r/o fracture or dislocation and reevaluate.  Plain film negative for fracture, dislocation, soft tissue swelling.  Low suspicion for septic joint, hemarthrosis.  Gout vs possible musculoskeletal injury however  no overlying swelling, erythema, warmth.  No evidence of ingrown toenail.  Discussed naproxen as well as ice, elevation and follow-up with PCP for reevaluation.  Patient was given resources to establish care with provider.  Low suspicion for emergent pathology causing patient's symptoms at this time.  Patient is hemodynamically stable and appropriate for DC home at this time.  Discussed strict return precautions.  Patient voiced understanding and is agreeable for follow-up.    Final Clinical Impressions(s) / ED Diagnoses   Final diagnoses:  Pain of toe of left foot    ED Discharge Orders         Ordered    naproxen (NAPROSYN) 500 MG tablet  2 times daily     01/23/18 0943           Aubery Douthat A, PA-C 01/23/18 1610    Vanetta Mulders, MD 01/27/18 1056

## 2018-01-23 NOTE — ED Triage Notes (Signed)
Pt states his left foot has been hurting for a week with no injury.  Has been taking Naproxen for pain with some relief.  Last taken yesterday.

## 2018-01-23 NOTE — Discharge Instructions (Addendum)
You were evaluated today for toe pain. This is likely gout. I do not see any evidence of infection. Please take Naproxen as prescribed and follow up with PCP for re-evaluation.  Return to the ED with any new or worsening symptoms such as: SOLICITE AYUDA SI: Tiene otra crisis de gota. Sigue teniendo sntomas de [place] crisis de gota despus de [address] das de [place]at[place]. Tiene problemas (efectos secundarios) debido a los medicamentos. Tiene escalofros o fiebre. Tiene sensacin de Bed Bath [employer] Beyondquemazn al ConocoPhillipsorinar. Siente dolor en la parte inferior de la espalda o en el abdomen. SOLICITE AYUDA DE INMEDIATO SI: Siente mucho dolor. No es posible Human resources officercontrolar el dolor. No puede orinar. Esta informacin no tiene como fin reempla

## 2019-07-11 IMAGING — DX DG LUMBAR SPINE COMPLETE 4+V
5 series · 5 of 5 positions shown · non-contrast
Comparison: None.

CLINICAL DATA: Low back pain for the past several days.

EXAM:
LUMBAR SPINE - COMPLETE 4+ VIEW

[l-spine ap]
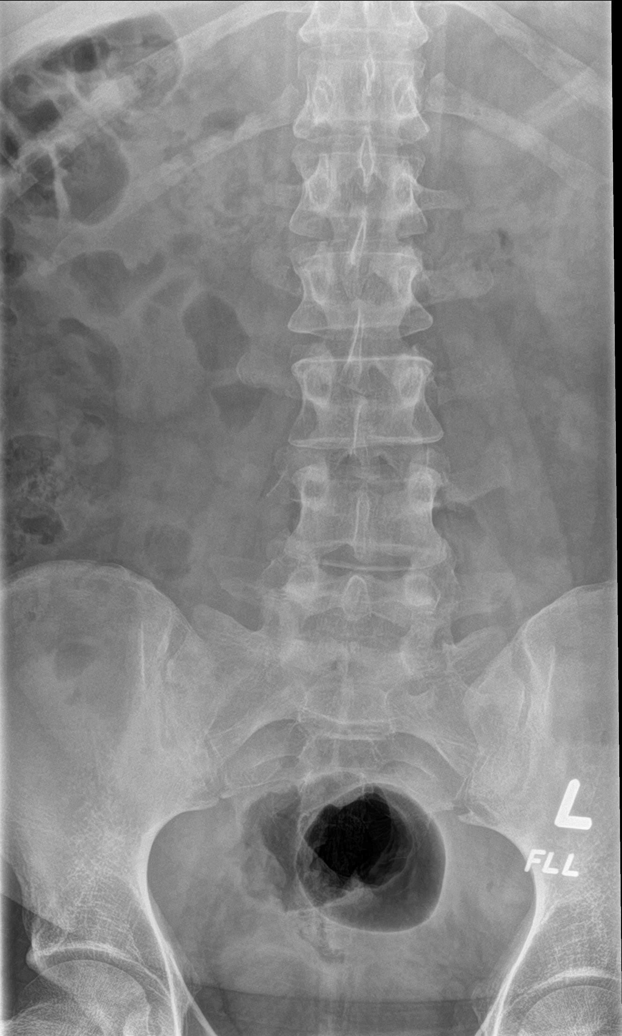

[l-spine obl (1 of 2)]
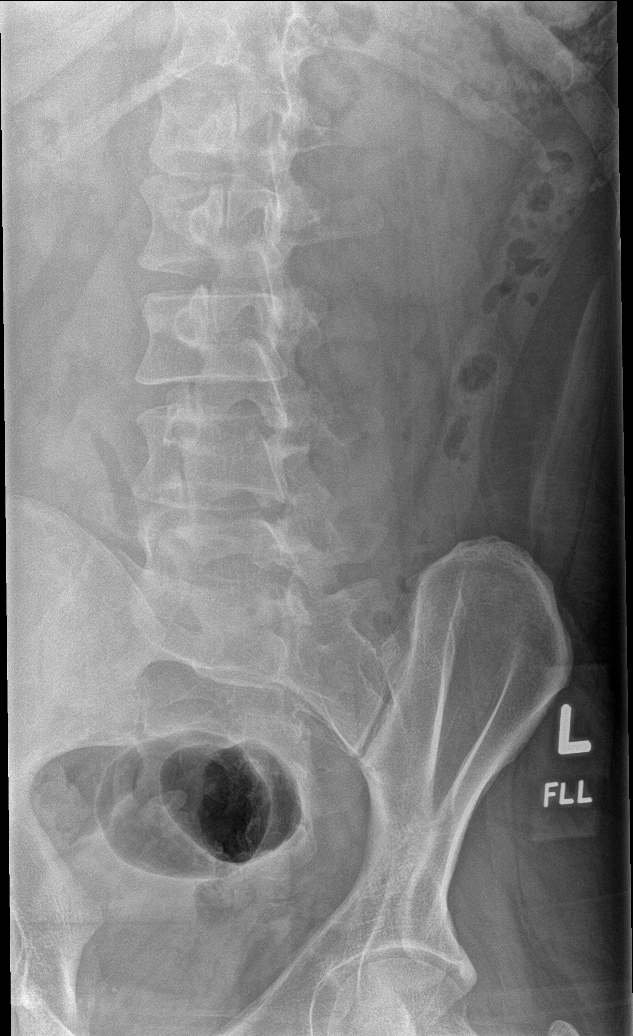

[l-spine obl (2 of 2)]
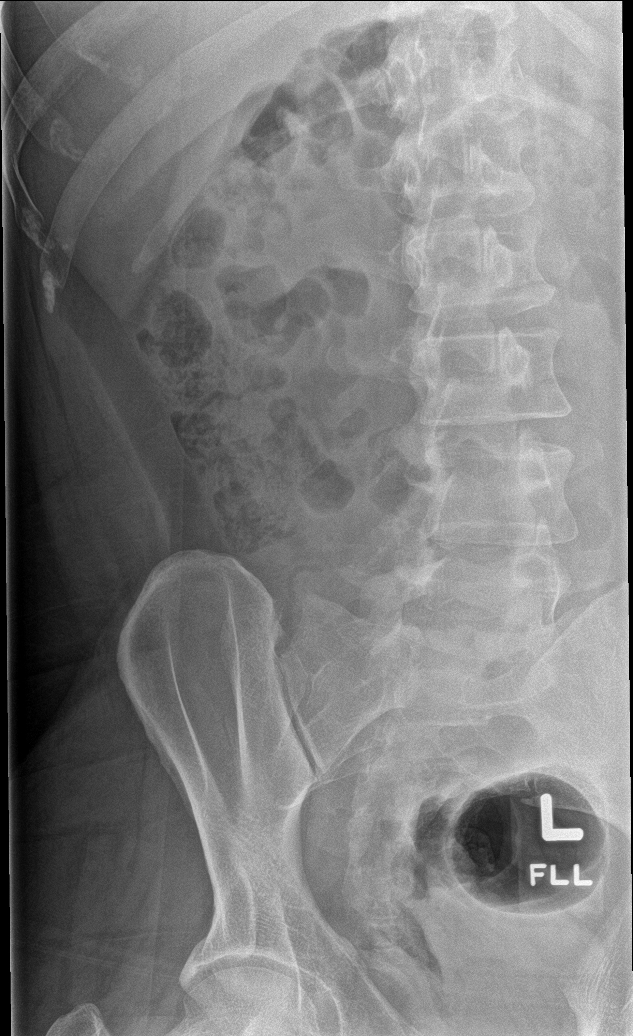

[l-spine lat]
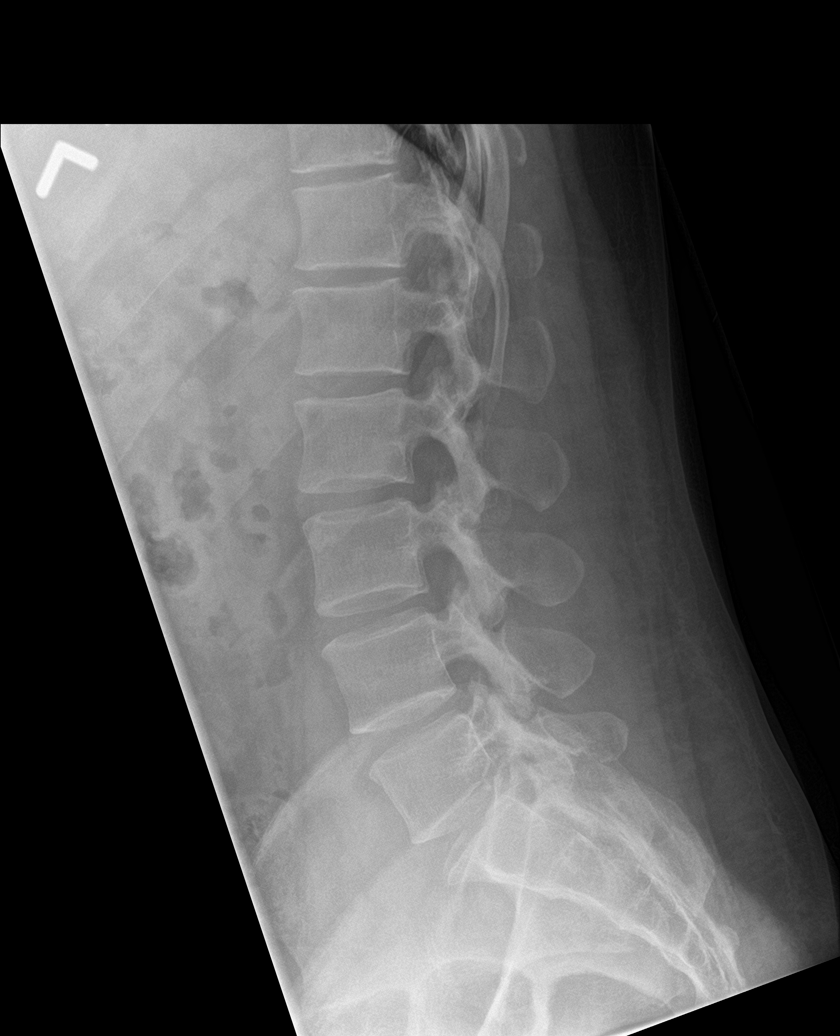

[l-spine spot]
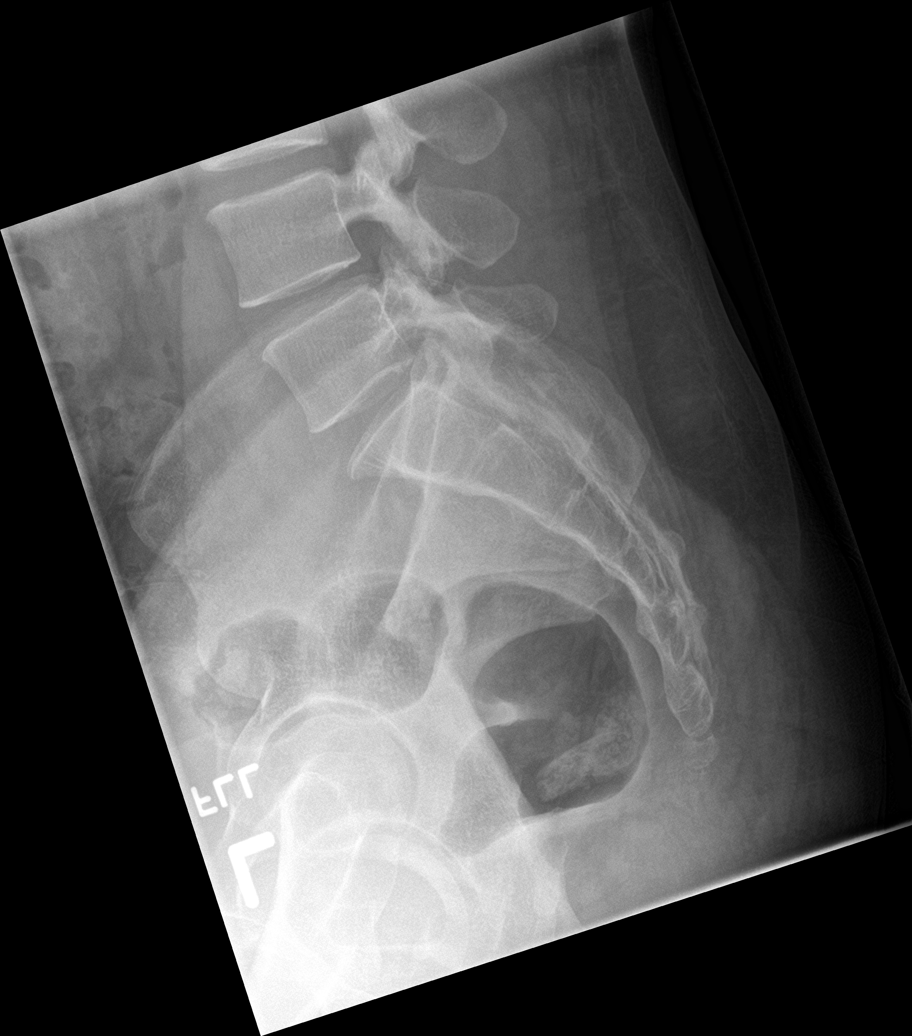

[5 of 5 positions shown; findings below may reference images not displayed]

FINDINGS: There are 5 non rib-bearing lumbar type vertebral bodies.

There is a mild (under 5 degrees) scoliotic curvature of the
thoracolumbar spine with dominant caudal component convex the left,
potentially positional. There is minimal straightening of the
expected lumbar lordosis. No anterolisthesis or retrolisthesis. No
definite pars defects.

Lumbar vertebral body heights appear preserved.

Lumbar intervertebral disc space heights appear preserved.

Limited visualization of the bilateral SI joints and hips is normal.

Regional soft tissues are normal.
IMPRESSION: Minimal straightening of the expected lumbar lordosis, nonspecific
though could be seen in the setting of muscle spasm. Otherwise, no
explanation for patient's low back pain.

## 2019-10-27 IMAGING — DX DG FOOT COMPLETE 3+V*L*
3 series · 3 of 3 positions shown · non-contrast
Comparison: None.

CLINICAL DATA: Acute left foot pain without known injury.

EXAM:
LEFT FOOT - COMPLETE 3+ VIEW

[foot ap]
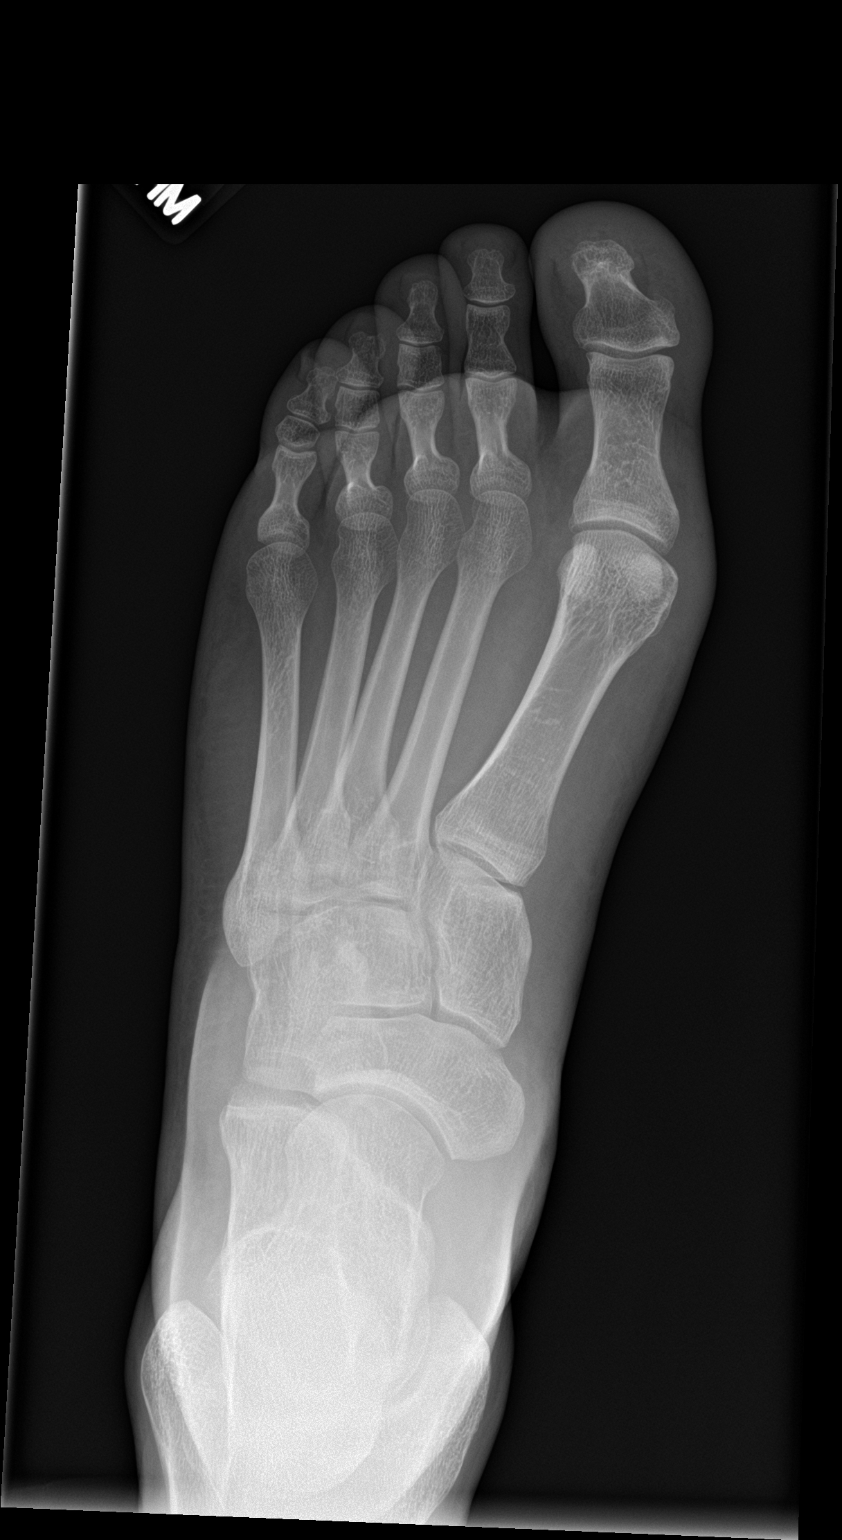

[foot obl]
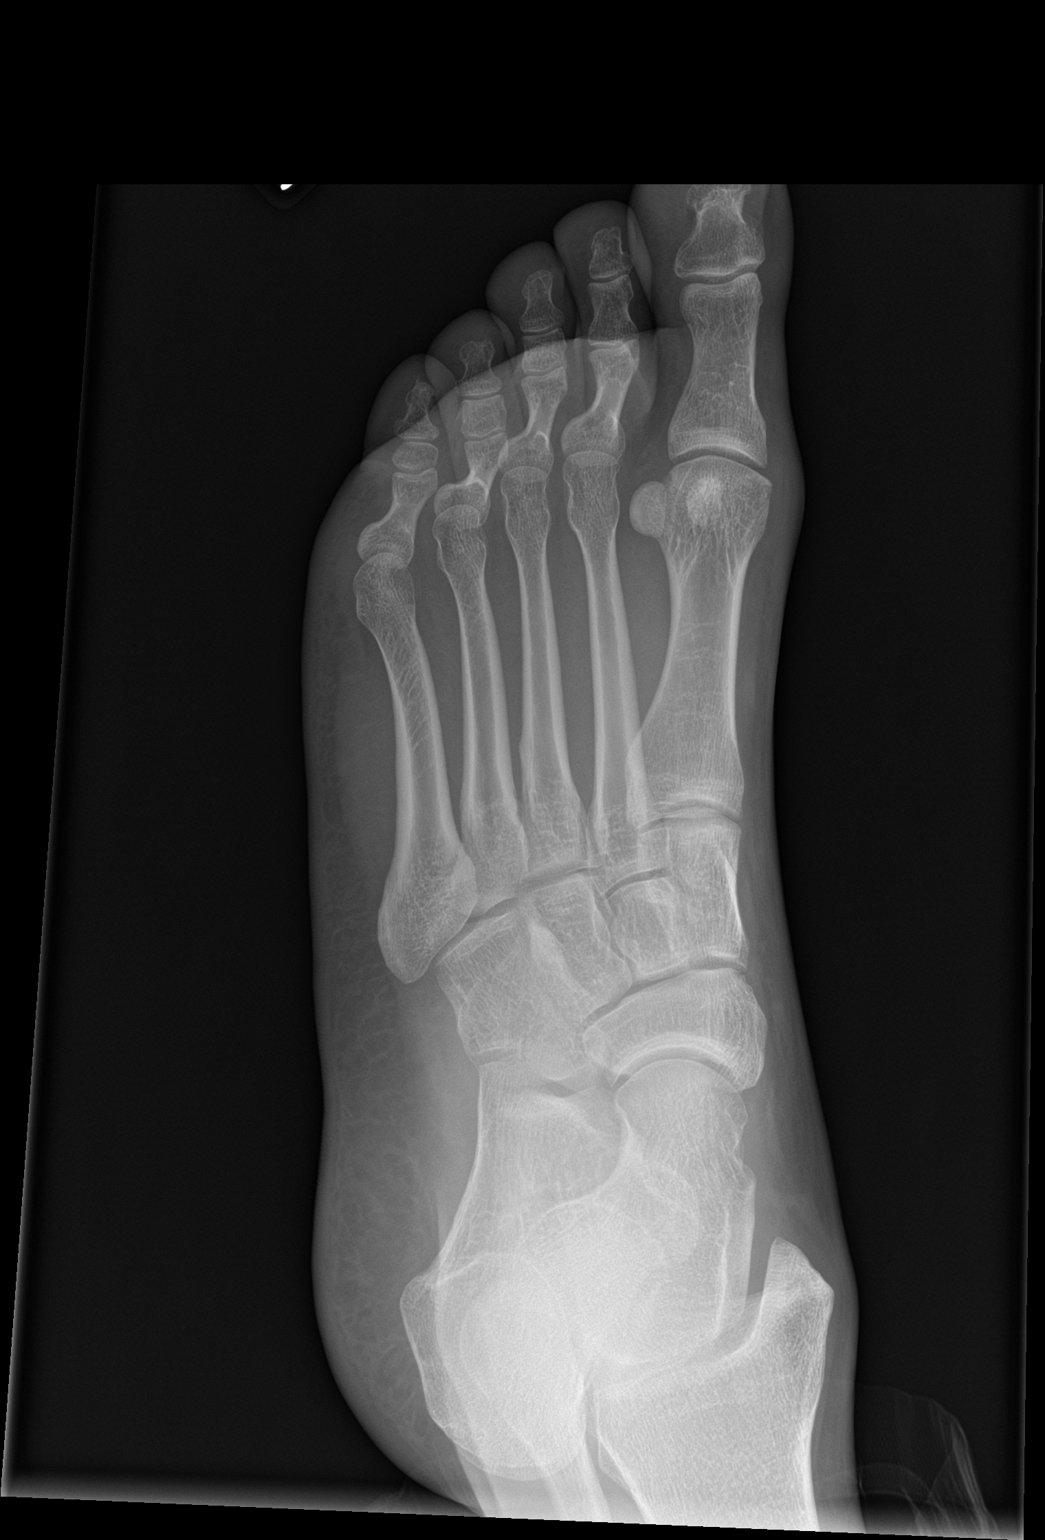

[foot lat]
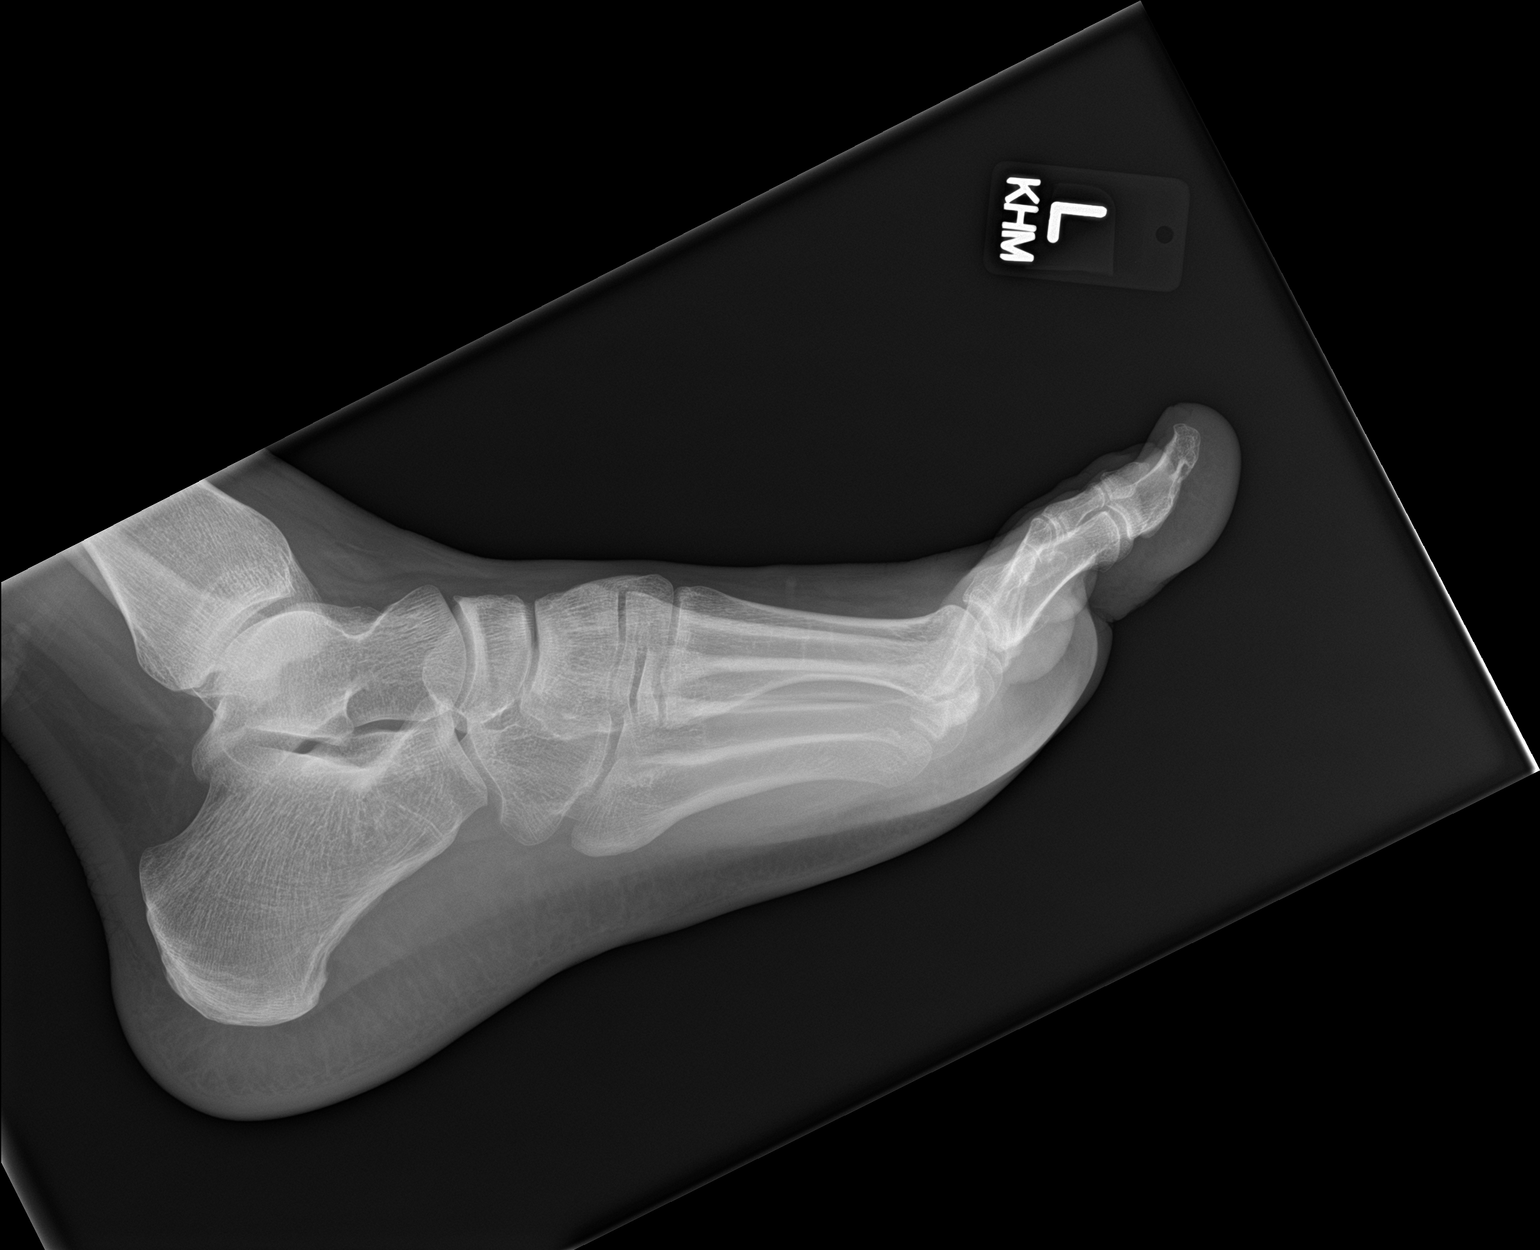

[3 of 3 positions shown; findings below may reference images not displayed]

FINDINGS: There is no evidence of fracture or dislocation. There is no
evidence of arthropathy or other focal bone abnormality. Soft
tissues are unremarkable.
IMPRESSION: Negative.

## 2020-09-27 ENCOUNTER — Ambulatory Visit: Payer: Self-pay | Admitting: Urology

## 2020-09-27 DIAGNOSIS — N411 Chronic prostatitis: Secondary | ICD-10-CM

## 2020-11-29 ENCOUNTER — Other Ambulatory Visit: Payer: Self-pay

## 2020-11-29 ENCOUNTER — Encounter: Payer: Self-pay | Admitting: Cardiology

## 2020-11-29 ENCOUNTER — Ambulatory Visit (INDEPENDENT_AMBULATORY_CARE_PROVIDER_SITE_OTHER): Payer: Self-pay | Admitting: Cardiology

## 2020-11-29 ENCOUNTER — Ambulatory Visit (INDEPENDENT_AMBULATORY_CARE_PROVIDER_SITE_OTHER): Payer: Self-pay | Admitting: Urology

## 2020-11-29 VITALS — BP 123/66 | HR 99

## 2020-11-29 VITALS — BP 122/70 | HR 74 | Ht 67.0 in | Wt 204.2 lb

## 2020-11-29 DIAGNOSIS — N419 Inflammatory disease of prostate, unspecified: Secondary | ICD-10-CM

## 2020-11-29 DIAGNOSIS — R0789 Other chest pain: Secondary | ICD-10-CM

## 2020-11-29 DIAGNOSIS — R300 Dysuria: Secondary | ICD-10-CM

## 2020-11-29 DIAGNOSIS — I4510 Unspecified right bundle-branch block: Secondary | ICD-10-CM

## 2020-11-29 DIAGNOSIS — R072 Precordial pain: Secondary | ICD-10-CM

## 2020-11-29 DIAGNOSIS — R102 Pelvic and perineal pain: Secondary | ICD-10-CM

## 2020-11-29 DIAGNOSIS — G8929 Other chronic pain: Secondary | ICD-10-CM

## 2020-11-29 LAB — URINALYSIS, ROUTINE W REFLEX MICROSCOPIC
Bilirubin, UA: NEGATIVE
Glucose, UA: NEGATIVE
Ketones, UA: NEGATIVE
Leukocytes,UA: NEGATIVE
Nitrite, UA: NEGATIVE
Protein,UA: NEGATIVE
RBC, UA: NEGATIVE
Specific Gravity, UA: 1.02 (ref 1.005–1.030)
Urobilinogen, Ur: 0.2 mg/dL (ref 0.2–1.0)
pH, UA: 6 (ref 5.0–7.5)

## 2020-11-29 MED ORDER — SULFAMETHOXAZOLE-TRIMETHOPRIM 800-160 MG PO TABS: 1.0000 | ORAL_TABLET | Freq: Two times a day (BID) | ORAL | 0 refills | Status: DC

## 2020-11-29 MED ORDER — METOPROLOL TARTRATE 100 MG PO TABS: 100.0000 mg | ORAL_TABLET | Freq: Once | ORAL | 0 refills | Status: AC

## 2020-11-29 NOTE — Progress Notes (Signed)
Urological Symptom Review  Patient is experiencing the following symptoms: Burning/pain with urination Have to strain to urinate (sometimes)   Review of Systems  Gastrointestinal (upper)  : Negative for upper GI symptoms  Gastrointestinal (lower) : Negative for lower GI symptoms  Constitutional : Negative for symptoms  Skin: Negative for skin symptoms  Eyes: Negative for eye symptoms  Ear/Nose/Throat : Negative for Ear/Nose/Throat symptoms  Hematologic/Lymphatic: Negative for Hematologic/Lymphatic symptoms  Cardiovascular : Negative for cardiovascular symptoms  Respiratory : Negative for respiratory symptoms  Endocrine: Negative for endocrine symptoms  Musculoskeletal: Negative for musculoskeletal symptoms  Neurological: Negative for neurological symptoms  Psychologic: Negative for psychiatric symptoms

## 2020-11-29 NOTE — Patient Instructions (Signed)
Medication Instructions:  Your physician recommends that you continue on your current medications as directed. Please refer to the Current Medication list given to you today.  *If you need a refill on your cardiac medications before your next appointment, please call your pharmacy*   Lab Work: BMET - prior to CT scan If you have labs (blood work) drawn today and your tests are completely normal, you will receive your results only by: MyChart Message (if you have MyChart) OR A paper copy in the mail If you have any lab test that is abnormal or we need to change your treatment, we will call you to review the results.   Testing/Procedures: Your physician has requested that you have an echocardiogram. Echocardiography is a painless test that uses sound waves to create images of your heart. It provides your doctor with information about the size and shape of your heart and how well your heart's chambers and valves are working. This procedure takes approximately one hour. There are no restrictions for this procedure.  Your physician has requested that you have a coronary CTA scan. Please see below for further instructions.   Follow-Up: At [facility], you and your health needs are our priority.  As part of our continuing mission to provide you with exceptional heart care, we have created designated Provider Care Teams.  These Care Teams include your primary Cardiologist (physician) and Advanced Practice Providers (APPs -  Physician Assistants and Nurse Practitioners) who all work together to provide you with the care you need, when you need it.  Follow up with Dr. Mayford Knife as needed based on results of testing   Other Instructions   Your cardiac CT will be scheduled at one of the below locations:   [facility] [address] [place], [place] [address] [phone]  Please arrive at the [facility] main entrance (entrance A) of [facility] 30 minutes prior to test  start time. You can use the FREE valet parking offered at the main entrance (encouraged to control the heart rate for the test) [facility] (first floor) to check-in and test prep.  Please follow these instructions carefully (unless otherwise directed):  Hold all erectile dysfunction medications at least 3 days (72 hrs) prior to test.  On the Night Before the Test: Be sure to Drink plenty of water. Do not consume any caffeinated/decaffeinated beverages or chocolate 12 hours prior to your test. Do not take any antihistamines 12 hours prior to your test.  On the Day of the Test: Drink plenty of water until 1 hour prior to the test. Do not eat any food 4 hours prior to the test. You may take your regular medications prior to the test.  Take metoprolol (Lopressor) two hours prior to test.  After the Test: Drink plenty of water. After receiving IV contrast, you may experience a mild flushed feeling. This is normal. On occasion, you may experience a mild rash up to 24 hours after the test. This is not dangerous. If this occurs, you can take Benadryl 25 mg and increase your fluid intake. If you experience trouble breathing, this can be serious. If it is severe call 911 IMMEDIATELY. If it is mild, please call our office. If you take any of these medications: Glipizide/Metformin, Avandament, Glucavance, please do not take 48 hours after completing test unless otherwise instructed.  Please allow 2-4 weeks for scheduling of routine cardiac CTs. Some insurance companies require a pre-authorization which may  delay scheduling of this test.   For non-scheduling related questions, please contact the cardiac imaging nurse navigator should you have any questions/concerns: Rockwell Alexandria, Cardiac Imaging Nurse Navigator Larey Brick, Cardiac Imaging Nurse Navigator  [facility] Vascular Services Direct Office Dial: [phone]   For scheduling needs,  including cancellations and rescheduling, please call [place], [phone].

## 2020-11-29 NOTE — Progress Notes (Signed)
Subjective: 1. Prostatitis, unspecified prostatitis type   2. Dysuria   3. Perineal pain in male, chronic      Consult requested by [facility]  Luis Becker is a 35 yo male who is referred for a history of prostatitis for past year.  He has some pain that mild in the penis and dysuria.  He has no dietary irritants that he has noticed.  He has been treatment with multiple meds including tamsulosin and doxycyline.  His UA today is clear.   STD testing was negative.  He is uncircumcised.  He is currently finasteride and doxycycline.  He has no frequency, nocturia or urgency.   His IPSS is 3.  PSA was 0.56.  I have reviewed his records from [facility]. ROS:  ROS  No Known Allergies  Past Medical History:  Diagnosis Date   Diabetes mellitus without complication (HCC)    per pt pre diabetic    No past surgical history on file.  Social History   Socioeconomic History   Marital status: Single    Spouse name: Not on file   Number of children: Not on file   Years of education: Not on file   Highest education level: Not on file  Occupational History   Not on file  Tobacco Use   Smoking status: Former   Smokeless tobacco: Never  Substance and Sexual Activity   Alcohol use: No   Drug use: No   Sexual activity: Not on file  Other Topics Concern   Not on file  Social History Narrative   Not on file   Social Determinants of Health   Financial Resource Strain: Not on file  Food Insecurity: Not on file  Transportation Needs: Not on file  Physical Activity: Not on file  Stress: Not on file  Social Connections: Not on file  Intimate Partner Violence: Not on file    Family History  Problem Relation Age of Onset   Asthma Brother    Hyperlipidemia Brother     Anti-infectives: Anti-infectives (From admission, onward)    Start     Dose/Rate Route Frequency Ordered Stop   11/29/20 0000  sulfamethoxazole-trimethoprim (BACTRIM DS) 800-160 MG tablet        1 tablet Oral Every  12 hours 11/29/20 1604         Current Outpatient Medications  Medication Sig Dispense Refill   cyclobenzaprine (FLEXERIL) 5 MG tablet Take 5 mg by mouth 3 (three) times daily as needed for muscle spasms.     ibuprofen (ADVIL) 800 MG tablet Take 800 mg by mouth every 8 (eight) hours as needed.     sulfamethoxazole-trimethoprim (BACTRIM DS) 800-160 MG tablet Take 1 tablet by mouth every 12 (twelve) hours. 60 tablet 0   metoprolol tartrate (LOPRESSOR) 100 MG tablet Take 1 tablet (100 mg total) by mouth once for 1 dose. Take 1 tablet (100 mg total) two hours prior to CT scan. (Patient not taking: Reported on 11/29/2020) 1 tablet 0   No current facility-administered medications for this visit.     Objective: Vital signs in last 24 hours: BP 123/66   Pulse 99   Intake/Output from previous day: No intake/output data recorded. Intake/Output this shift: @IOTHISSHIFT @   Physical Exam Genitourinary:    Comments: Uncirc phallus with adequate meatus without balanitis. Scrotum, testes and epididymis normal. AP without lesions. NST without mass. Prostate 1.5+ benign. SV non-palpable.    Lab Results:  Results for orders placed or performed in visit on 11/29/20 (from  the past 24 hour(s))  Urinalysis, Routine w reflex microscopic     Status: Abnormal   Collection Time: 11/29/20  3:53 PM  Result Value Ref Range   Specific Gravity, UA 1.020 1.005 - 1.030   pH, UA 6.0 5.0 - 7.5   Color, UA Amber (A) Yellow   Appearance Ur Clear Clear   Leukocytes,UA Negative Negative   Protein,UA Negative Negative/Trace   Glucose, UA Negative Negative   Ketones, UA Negative Negative   RBC, UA Negative Negative   Bilirubin, UA Negative Negative   Urobilinogen, Ur 0.2 0.2 - 1.0 mg/dL   Nitrite, UA Negative Negative   Microscopic Examination Comment    Narrative   Performed at:  [address] - [facility] [address], [place], [place]  [id number] Lab Director: Chinita Pester MT, Phone:   [phone]    BMET No results for input(s): NA, K, CL, CO2, GLUCOSE, BUN, CREATININE, CALCIUM in the last 72 hours. PT/INR No results for input(s): LABPROT, INR in the last 72 hours. ABG No results for input(s): PHART, HCO3 in the last 72 hours.  Invalid input(s): PCO2, PO2  Studies/Results: No results found.   Assessment/Plan: Prostatitis with perineal pain and dysuria.   I am going to have him stop the finasteride and doxycycline and will have him return for cystoscopy.  I will give him a trial of bactrim in the interim.   Meds ordered this encounter  Medications   sulfamethoxazole-trimethoprim (BACTRIM DS) 800-160 MG tablet    Sig: Take 1 tablet by mouth every 12 (twelve) hours.    Dispense:  60 tablet    Refill:  0     Orders Placed This Encounter  Procedures   Urinalysis, Routine w reflex microscopic     Return for Next available cystoscopy.    CC: [facility].      [name] 11/29/2020 [phone]

## 2020-11-29 NOTE — Addendum Note (Signed)
Addended by: Theresia Majors on: 11/29/2020 01:23 PM   Modules accepted: Orders

## 2020-11-29 NOTE — Progress Notes (Addendum)
Cardiology CONSULT Note    Date:  11/29/2020   ID:  Luis Becker, DOB [birth year], MRN [id number]  PCP:  Patient, No Pcp Per (Inactive)  Cardiologist:  Armanda Magic, MD   Chief Complaint  Patient presents with   New Patient (Initial Visit)    Atypical chest pain and EKG abnormality     History of Present Illness:  Luis Becker is a 35 y.o. male who is being seen today for the evaluation of abnormal EKG at the request of [facility]  This is a 35 year old Hispanic male with a history of diabetes although the patient states he is prediabetic he recently saw his PCP with complaints from with a history of a "pinching sensation" in his heart that has been going on for several days but not every day.  This started back in August.  He apparently had felt the same sensation back in 2019 when he stretched his arm out and then again in 2021.  He says it may feel like an area gas bubble and moves to the left side of his chest.  He has been on [employer] and ego plant on the life products for weight loss and has been successful.  EKG at the PCP office showed normal sinus rhythm with an incomplete right bundle branch block labs that were done on 09/13/2020 showed a total cholesterol 182 HDL 32 triglycerides 343 HDL 104 glucose 92 BUN 15 creatinine 0.75 sodium 142 LFTs normal and vitamin D low at 21.  He is now referred for evaluation of atypical chest pain and abnormal EKG.  He tells me that about 3 weeks ago he had some more CP midsternal.  His labs were checked and he was started on lipid meds and central pain went away.  He still has pain in the upper chest that radiates into his axilla and back.  It is improved with stretching his left arm.  It will last for about 4 hours and then resolves with walking and drinking water.  He denies any associated sx of SOB, nausea, dizzy or diaphoresis.   He denies any shortness of breath, PND, orthopnea, lower extremity edema, dizziness, palpitations or  syncope.  He used to smoke years ago but quit 4 years and when smoking he would go through 4 cig daily.    Past Medical History:  Diagnosis Date   Diabetes mellitus without complication (HCC)    per pt pre diabetic    No past surgical history on file.  Current Medications: No outpatient medications have been marked as taking for the 11/29/20 encounter (Office Visit) with Quintella Reichert, MD.    Allergies:   Patient has no known allergies.   Social History   Socioeconomic History   Marital status: Single    Spouse name: Not on file   Number of children: Not on file   Years of education: Not on file   Highest education level: Not on file  Occupational History   Not on file  Tobacco Use   Smoking status: Former   Smokeless tobacco: Never  Substance and Sexual Activity   Alcohol use: No   Drug use: No   Sexual activity: Not on file  Other Topics Concern   Not on file  Social History Narrative   Not on file   Social Determinants of Health   Financial Resource Strain: Not on file  Food Insecurity: Not on file  Transportation Needs: Not on file  Physical Activity: Not on file  Stress: Not on file  Social Connections: Not on file     Family History:  The patient's family history is not on file.   ROS:   Please see the history of present illness.    ROS All other systems reviewed and are negative.  No flowsheet data found.     PHYSICAL EXAM:   VS:  BP 122/70 (BP Location: Left Arm, Patient Position: Sitting, Cuff Size: Normal)   Pulse 74   Ht 5\' 7"  (1.702 m)   Wt 204 lb 3.2 oz (92.6 kg)   SpO2 99%   BMI 31.98 kg/m    GEN: Well nourished, well developed, in no acute distress  HEENT: normal  Neck: no JVD, carotid bruits, or masses Cardiac: RRR; no murmurs, rubs, or gallops,no edema.  Intact distal pulses bilaterally.  Respiratory:  clear to auscultation bilaterally, normal work of breathing GI: soft, nontender, nondistended, + BS MS: no deformity or  atrophy  Skin: warm and dry, no rash Neuro:  Alert and Oriented x 3, Strength and sensation are intact Psych: euthymic mood, full affect  Wt Readings from Last 3 Encounters:  11/29/20 204 lb 3.2 oz (92.6 kg)  01/23/18 222 lb 10.6 oz (101 kg)  10/07/17 224 lb (101.6 kg)      Studies/Labs Reviewed:   EKG:  EKG is ordered today.  The ekg ordered today demonstrates NSR   Recent Labs: No results found for requested labs within last 8760 hours.   Lipid Panel No results found for: CHOL, TRIG, HDL, CHOLHDL, VLDL, LDLCALC, LDLDIRECT    Additional studies/ records that were reviewed today include:  OV, labs and EKG from PCP    ASSESSMENT:    1. Atypical chest pain   2. Incomplete right bundle branch block      PLAN:  In order of problems listed above:  Atypical chest pain -I do not think this is cardiac related. -His EKG shows an incomplete right bundle branch block at PCP office but normal here  -His only cardiac risk factor is remote history of tobacco use and diabetes -Recommend coronary CTA using the flash protocol given his young age to define coronary anatomy and rule out anomalous coronary artery and early CAD  2.  Incomplete right bundle branch block -I will get a 2D echo to rule out structural heart disease especially PFO that can be seen with right bundle branch block  Time Spent: 25 minutes total time of encounter, including 15 minutes spent in face-to-face patient care on the date of this encounter. This time includes coordination of care and counseling regarding above mentioned problem list. Remainder of non-face-to-face time involved reviewing chart documents/testing relevant to the patient encounter and documentation in the medical record. I have independently reviewed documentation from referring provider  Medication Adjustments/Labs and Tests Ordered: Current medicines are reviewed at length with the patient today.  Concerns regarding medicines are outlined  above.  Medication changes, Labs and Tests ordered today are listed in the Patient Instructions below.  There are no Patient Instructions on file for this visit.   Signed, 10/09/17, MD  11/29/2020 1:16 PM    [facility] [address] [place], [place], [place]  [place] Phone: [phone]; Fax: [phone]

## 2021-01-07 ENCOUNTER — Other Ambulatory Visit: Payer: Self-pay | Admitting: Urology

## 2021-01-10 ENCOUNTER — Other Ambulatory Visit: Payer: Self-pay

## 2021-01-10 ENCOUNTER — Ambulatory Visit (HOSPITAL_COMMUNITY)
Admission: RE | Admit: 2021-01-10 | Discharge: 2021-01-10 | Disposition: A | Discharge status: Home / Self Care | Payer: Self-pay | Source: Ambulatory Visit | Attending: Cardiology | Admitting: Cardiology

## 2021-01-10 DIAGNOSIS — R0789 Other chest pain: Secondary | ICD-10-CM | POA: Insufficient documentation

## 2021-01-10 LAB — ECHOCARDIOGRAM COMPLETE
Area-P 1/2: 3.12 cm2
S' Lateral: 2.8 cm

## 2021-01-10 NOTE — Progress Notes (Signed)
*  PRELIMINARY RESULTS* Echocardiogram 2D Echocardiogram has been performed.  [name] 01/10/2021, 2:52 PM

## 2021-02-22 ENCOUNTER — Other Ambulatory Visit: Payer: Self-pay | Admitting: Urology

## 2021-03-14 ENCOUNTER — Other Ambulatory Visit: Payer: Self-pay | Admitting: Urology

## 2021-03-14 DIAGNOSIS — R300 Dysuria: Secondary | ICD-10-CM

## 2021-06-24 ENCOUNTER — Other Ambulatory Visit: Payer: Self-pay | Admitting: Urology

## 2021-08-23 ENCOUNTER — Other Ambulatory Visit: Payer: Self-pay | Admitting: Urology

## 2021-11-15 DIAGNOSIS — I4510 Unspecified right bundle-branch block: Secondary | ICD-10-CM | POA: Insufficient documentation

## 2021-11-15 DIAGNOSIS — Z87438 Personal history of other diseases of male genital organs: Secondary | ICD-10-CM | POA: Insufficient documentation

## 2021-11-15 DIAGNOSIS — R7303 Prediabetes: Secondary | ICD-10-CM | POA: Insufficient documentation

## 2022-01-25 ENCOUNTER — Encounter (HOSPITAL_COMMUNITY): Payer: Self-pay

## 2022-01-25 ENCOUNTER — Ambulatory Visit (HOSPITAL_COMMUNITY)
Admission: EM | Admit: 2022-01-25 | Discharge: 2022-01-25 | Disposition: A | Discharge status: Home / Self Care | Payer: Self-pay | Attending: Emergency Medicine | Admitting: Emergency Medicine

## 2022-01-25 DIAGNOSIS — R300 Dysuria: Secondary | ICD-10-CM

## 2022-01-25 LAB — POCT URINALYSIS DIPSTICK, ED / UC
Bilirubin Urine: NEGATIVE
Glucose, UA: NEGATIVE mg/dL
Ketones, ur: NEGATIVE mg/dL
Leukocytes,Ua: NEGATIVE
Nitrite: NEGATIVE
Protein, ur: NEGATIVE mg/dL
Specific Gravity, Urine: 1.02 (ref 1.005–1.030)
Urobilinogen, UA: 0.2 mg/dL (ref 0.0–1.0)
pH: 6.5 (ref 5.0–8.0)

## 2022-01-25 MED ORDER — SULFAMETHOXAZOLE-TRIMETHOPRIM 800-160 MG PO TABS: 1.0000 | ORAL_TABLET | Freq: Two times a day (BID) | ORAL | 0 refills | Status: AC

## 2022-01-25 NOTE — ED Triage Notes (Signed)
Patient with c/o burning with urination (before and during urination). Patient states he has been seen at other doctors and it has not went away. Patient was given abx. States the problem has been going on for 4 years. States he was told he has an enlarged.

## 2022-01-25 NOTE — Discharge Instructions (Addendum)
Take antibiotic as prescribed. Drink plenty of fluids. We will call with test results and change treatment if indicated. Recommend following up with urologist.

## 2022-01-25 NOTE — ED Provider Notes (Signed)
[facility]    CSN: [id number] Arrival date & time: 01/25/22  1530      History   Chief Complaint No chief complaint on file.   HPI Luis Becker is a 36 y.o. male.   Patient with a history of prostatitis has been followed by urology in the past.  Complains of dysuria that started several days ago.  Denies fever chills nausea vomiting.  Denies penile discharge.    Past Medical History:  Diagnosis Date   Diabetes mellitus without complication (HCC)    per pt pre diabetic    There are no problems to display for this patient.   No past surgical history on file.     Home Medications    Prior to Admission medications   Medication Sig Start Date End Date Taking? Authorizing Provider  cyclobenzaprine (FLEXERIL) 5 MG tablet Take 5 mg by mouth 3 (three) times daily as needed for muscle spasms.    [provider]  ibuprofen (ADVIL) 800 MG tablet Take 800 mg by mouth every 8 (eight) hours as needed.    [provider]  metoprolol tartrate (LOPRESSOR) 100 MG tablet Take 1 tablet (100 mg total) by mouth once for 1 dose. Take 1 tablet (100 mg total) two hours prior to CT scan. Patient not taking: Reported on 11/29/2020 11/29/20 11/29/20  Quintella Reichert, MD  sulfamethoxazole-trimethoprim (BACTRIM DS) 800-160 MG tablet Take 1 tablet by mouth 2 (two) times daily for 5 days. 01/25/22 01/30/22 Yes Ward, Tylene Fantasia, PA-C    Family History Family History  Problem Relation Age of Onset   Asthma Brother    Hyperlipidemia Brother     Social History Social History   Tobacco Use   Smoking status: Former   Smokeless tobacco: Never  Substance Use Topics   Alcohol use: No   Drug use: No     Allergies   Patient has no known allergies.   Review of Systems Review of Systems  Constitutional:  Negative for chills and fever.  HENT:  Negative for ear pain and sore throat.   Eyes:  Negative for pain and visual disturbance.  Respiratory:  Negative for  cough and shortness of breath.   Cardiovascular:  Negative for chest pain and palpitations.  Gastrointestinal:  Negative for abdominal pain and vomiting.  Genitourinary:  Positive for dysuria. Negative for hematuria.  Musculoskeletal:  Negative for arthralgias and back pain.  Skin:  Negative for color change and rash.  Neurological:  Negative for seizures and syncope.  All other systems reviewed and are negative.    Physical Exam Triage Vital Signs ED Triage Vitals  Enc Vitals Group     BP      Pulse      Resp      Temp      Temp src      SpO2      Weight      Height      Head Circumference      Peak Flow      Pain Score      Pain Loc      Pain Edu?      Excl. in GC?    No data found.  Updated Vital Signs BP 138/78 (BP Location: Right Arm)   Pulse 68   Temp 98.6 F (37 C) (Oral)   Resp 18   SpO2 96%   Visual Acuity Right Eye Distance:   Left Eye Distance:   Bilateral Distance:  Right Eye Near:   Left Eye Near:    Bilateral Near:     Physical Exam Vitals and nursing note reviewed.  Constitutional:      General: He is not in acute distress.    Appearance: He is well-developed.  HENT:     Head: Normocephalic and atraumatic.  Eyes:     Conjunctiva/sclera: Conjunctivae normal.  Cardiovascular:     Rate and Rhythm: Normal rate and regular rhythm.     Heart sounds: No murmur heard. Pulmonary:     Effort: Pulmonary effort is normal. No respiratory distress.     Breath sounds: Normal breath sounds.  Abdominal:     Palpations: Abdomen is soft.     Tenderness: There is no abdominal tenderness.  Musculoskeletal:        General: No swelling.     Cervical back: Neck supple.  Skin:    General: Skin is warm and dry.     Capillary Refill: Capillary refill takes less than 2 seconds.  Neurological:     Mental Status: He is alert.  Psychiatric:        Mood and Affect: Mood normal.      UC Treatments / Results  Labs (all labs ordered are listed, but  only abnormal results are displayed) Labs Reviewed  POCT URINALYSIS DIPSTICK, ED / UC - Abnormal; Notable for the following components:      Result Value   Hgb urine dipstick TRACE (*)    All other components within normal limits  URINE CULTURE  CYTOLOGY, (ORAL, ANAL, URETHRAL) ANCILLARY ONLY    EKG   Radiology No results found.  Procedures Procedures (including critical care time)  Medications Ordered in UC Medications - No data to display  Initial Impression / Assessment and Plan / UC Course  I have reviewed the triage vital signs and the nursing notes.  Pertinent labs & imaging results that were available during my care of the patient were reviewed by me and considered in my medical decision making (see chart for details).     Dysuria. Patient with history of prostatitis has been followed by urology in the past.  Will prescribe course of Bactrim.  Will send out urine for culture.  Cytology swab done today in clinic to test for STDs.  Will change treatment plan based on results.  Advise follow-up with urology. Final Clinical Impressions(s) / UC Diagnoses   Final diagnoses:  Dysuria     Discharge Instructions      Take antibiotic as prescribed. Drink plenty of fluids. We will call with test results and change treatment if indicated. Recommend following up with urologist.      ED Prescriptions     Medication Sig Dispense Auth. Provider   sulfamethoxazole-trimethoprim (BACTRIM DS) 800-160 MG tablet Take 1 tablet by mouth 2 (two) times daily for 5 days. 10 tablet Ward, Tylene Fantasia, PA-C      PDMP not reviewed this encounter.   Ward, Tylene Fantasia, PA-C 01/25/22 1726

## 2022-01-26 LAB — URINE CULTURE

## 2023-04-15 DIAGNOSIS — E7800 Pure hypercholesterolemia, unspecified: Secondary | ICD-10-CM | POA: Insufficient documentation

## 2023-04-15 DIAGNOSIS — E559 Vitamin D deficiency, unspecified: Secondary | ICD-10-CM | POA: Insufficient documentation

## 2023-07-02 ENCOUNTER — Emergency Department (HOSPITAL_COMMUNITY)
Admission: EM | Admit: 2023-07-02 | Discharge: 2023-07-02 | Disposition: A | Discharge status: Home / Self Care | Payer: Self-pay | Attending: Emergency Medicine | Admitting: Emergency Medicine

## 2023-07-02 ENCOUNTER — Other Ambulatory Visit: Payer: Self-pay

## 2023-07-02 ENCOUNTER — Encounter (HOSPITAL_COMMUNITY): Payer: Self-pay

## 2023-07-02 ENCOUNTER — Emergency Department (HOSPITAL_COMMUNITY): Payer: Self-pay

## 2023-07-02 DIAGNOSIS — R300 Dysuria: Secondary | ICD-10-CM | POA: Insufficient documentation

## 2023-07-02 LAB — URINALYSIS, ROUTINE W REFLEX MICROSCOPIC
Bilirubin Urine: NEGATIVE
Glucose, UA: NEGATIVE mg/dL
Hgb urine dipstick: NEGATIVE
Ketones, ur: NEGATIVE mg/dL
Leukocytes,Ua: NEGATIVE
Nitrite: NEGATIVE
Protein, ur: NEGATIVE mg/dL
Specific Gravity, Urine: 1.012 (ref 1.005–1.030)
pH: 6 (ref 5.0–8.0)

## 2023-07-02 LAB — BASIC METABOLIC PANEL WITH GFR
Anion gap: 10 (ref 5–15)
BUN: 15 mg/dL (ref 6–20)
CO2: 25 mmol/L (ref 22–32)
Calcium: 9 mg/dL (ref 8.9–10.3)
Chloride: 101 mmol/L (ref 98–111)
Creatinine, Ser: 0.76 mg/dL (ref 0.61–1.24)
GFR, Estimated: >60 mL/min (ref >60)
Glucose, Bld: 92 mg/dL (ref 70–99)
Potassium: 3.8 mmol/L (ref 3.5–5.1)
Sodium: 136 mmol/L (ref 135–145)

## 2023-07-02 LAB — CBC
HCT: 48 % (ref 39.0–52.0)
Hemoglobin: 16.3 g/dL (ref 13.0–17.0)
MCH: 29.9 pg (ref 26.0–34.0)
MCHC: 34 g/dL (ref 30.0–36.0)
MCV: 88.1 fL (ref 80.0–100.0)
Platelets: 180 10*3/uL (ref 150–400)
RBC: 5.45 MIL/uL (ref 4.22–5.81)
RDW: 12.3 % (ref 11.5–15.5)
WBC: 9.6 10*3/uL (ref 4.0–10.5)
nRBC: 0 % (ref 0.0–0.2)

## 2023-07-02 MED ORDER — PHENAZOPYRIDINE HCL 200 MG PO TABS: 200.0000 mg | ORAL_TABLET | Freq: Three times a day (TID) | ORAL | 0 refills | Status: AC | PRN

## 2023-07-02 MED ORDER — IOHEXOL 300 MG/ML  SOLN
100.0000 mL | Freq: Once | INTRAMUSCULAR | Status: AC | PRN
  Administered 2023-07-02: 100 mL via INTRAVENOUS

## 2023-07-02 NOTE — Discharge Instructions (Signed)
 Please follow-up closely with urology on an outpatient basis.  You may continue taking the tadalafil as needed.  Return to the emergency department immediately for any new or worsening symptoms.

## 2023-07-02 NOTE — ED Provider Notes (Signed)
 [place] EMERGENCY DEPARTMENT AT [facility] Provider Note   CSN: [id number] Arrival date & time: 07/02/23  1623     History  Chief Complaint  Patient presents with   Dysuria   rectal burning    Elden Brucato is a 38 y.o. male.  Patient is a 38 year old male who presents to Emergency Department with a chief complaint of dysuria that has been ongoing and intermittent for approximate the past 6 years.  Patient notes that he has been seen by multiple providers outpatient to include urologist.  He notes that he has been treated for prostatitis multiple times.  Patient notes that he was recently placed on tadalafil to help with his symptoms.  He notes that he is also noted some burning around his rectum.  He currently denies any associate abdominal pain, nausea, vomiting, diarrhea.  He does note that he had some hematuria yesterday.  He denies any associated new sexual contacts or rectal intercourse.   Dysuria Presenting symptoms: dysuria        Home Medications Prior to Admission medications   Medication Sig Start Date End Date Taking? Authorizing Provider  cyclobenzaprine  (FLEXERIL ) 5 MG tablet Take 5 mg by mouth 3 (three) times daily as needed for muscle spasms.    [provider]  ibuprofen  (ADVIL ) 800 MG tablet Take 800 mg by mouth every 8 (eight) hours as needed.    [provider]  metoprolol  tartrate (LOPRESSOR ) 100 MG tablet Take 1 tablet (100 mg total) by mouth once for 1 dose. Take 1 tablet (100 mg total) two hours prior to CT scan. Patient not taking: Reported on 11/29/2020 11/29/20 11/29/20  Jacqueline Matsu, MD      Allergies    Patient has no known allergies.    Review of Systems   Review of Systems  Genitourinary:  Positive for dysuria.  All other systems reviewed and are negative.   Physical Exam Updated Vital Signs BP 130/70   Pulse 60   Temp 98 F (36.7 C) (Oral)   Resp 18   Ht 5\' 7"  (1.702 m)   Wt 102.1 kg   SpO2 100%    BMI 35.24 kg/m  Physical Exam Vitals and nursing note reviewed. Exam conducted with a chaperone present.  Constitutional:      Appearance: Normal appearance.  HENT:     Head: Normocephalic and atraumatic.     Nose: Nose normal.     Mouth/Throat:     Mouth: Mucous membranes are moist.  Eyes:     Extraocular Movements: Extraocular movements intact.     Conjunctiva/sclera: Conjunctivae normal.     Pupils: Pupils are equal, round, and reactive to light.  Cardiovascular:     Rate and Rhythm: Normal rate and regular rhythm.     Pulses: Normal pulses.     Heart sounds: Normal heart sounds.  Pulmonary:     Effort: Pulmonary effort is normal.     Breath sounds: Normal breath sounds.  Abdominal:     General: Abdomen is flat. Bowel sounds are normal. There is no distension.     Palpations: Abdomen is soft.     Tenderness: There is no abdominal tenderness. There is no guarding.  Genitourinary:    Penis: Normal.      Testes: Normal.     Prostate: Normal.     Rectum: Normal.  Musculoskeletal:        General: Normal range of motion.     Cervical back: Normal range of  motion and neck supple.  Skin:    General: Skin is warm and dry.  Neurological:     General: No focal deficit present.     Mental Status: He is alert and oriented to person, place, and time. Mental status is at baseline.  Psychiatric:        Mood and Affect: Mood normal.        Behavior: Behavior normal.        Thought Content: Thought content normal.        Judgment: Judgment normal.     ED Results / Procedures / Treatments   Labs (all labs ordered are listed, but only abnormal results are displayed) Labs Reviewed  BASIC METABOLIC PANEL WITH GFR  CBC  URINALYSIS, ROUTINE W REFLEX MICROSCOPIC  GC/CHLAMYDIA PROBE AMP ([place]) NOT AT [facility]    EKG None  Radiology No results found.  Procedures Procedures    Medications Ordered in ED Medications - No data to display  ED Course/ Medical Decision  Making/ A&P                                 Medical Decision Making Amount and/or Complexity of Data Reviewed Labs: ordered. Radiology: ordered.  Risk Prescription drug management.   This patient presents to the ED for concern of dysuria, rectal pain differential diagnosis includes urinary tract infection, pyelonephritis, prostatitis, hemorrhoids, perianal abscess, fistula    Additional history obtained:  Additional history obtained from none External records from outside source obtained and reviewed including records   Lab Tests:  I Ordered, and personally interpreted labs.  The pertinent results include: No leukocytosis, no anemia, normal electrolytes, normal kidney function liver function, unremarkable urinalysis   Imaging Studies ordered:  I ordered imaging studies including CT scan of abdomen and pelvis I independently visualized and interpreted imaging which showed inflammation of the bladder I agree with the radiologist interpretation   Medicines ordered and prescription drug management:  I ordered medication including Pyridium for dysuria Reevaluation of the patient after these medicines showed that the patient improved I have reviewed the patients home medicines and have made adjustments as needed   Problem List / ED Course:  Patient is doing well at this time and is stable for discharge home.  Discussed with patient urinalysis demonstrates no indication of urinary tract infection.  Blood work was otherwise unremarkable.  CT scan of abdomen pelvis is consistent with possible chronic cystitis.  Discussed with patient importance of close follow-up with urology on an outpatient basis for cystoscopy.  Patient does not appear to be suffering from prostatitis at this point.  Rectal exam performed with nurse chaperone demonstrated no tenderness along the prostate.  Vital signs are stable with no indication for sepsis.  Do not suspect any further emergent workup is  warranted.  Will provide Pyridium to help with symptomatic relief.  Patient is already on tadalafil and will continue taking this.  Strict return precautions were provided for any new or worsening symptoms.  Patient voiced understanding and had no additional questions.   Social Determinants of Health:  None           Final Clinical Impression(s) / ED Diagnoses Final diagnoses:  None    Rx / DC Orders ED Discharge Orders     None         Roselynn Connors, PA-C 07/02/23 1816    Merdis Stalling, MD 07/02/23 [phone]

## 2023-07-02 NOTE — ED Triage Notes (Signed)
 Pt reports:  Burning with urination Started 2018 Hx Treated several times with ABX No relief Tested for STD's negative Burning has never gone away Worsens with Spicy foods Had some earlier in the week Low water intake Rectum burning Started about 3 weeks ago Comes and goes

## 2023-07-04 LAB — URINE CULTURE: Culture: NO GROWTH

## 2023-07-07 NOTE — ED Notes (Signed)
 Lab called to state pt's GC/chlamydia test was not performed due to fault of the lab; this RN called to inform pt but no answer at number available and not able to leave a HIPAA compliant VM

## 2023-08-06 ENCOUNTER — Ambulatory Visit: Payer: Self-pay | Admitting: Urology

## 2023-09-01 ENCOUNTER — Encounter: Payer: Self-pay | Admitting: Urology

## 2023-09-01 ENCOUNTER — Ambulatory Visit (INDEPENDENT_AMBULATORY_CARE_PROVIDER_SITE_OTHER): Payer: Self-pay | Admitting: Urology

## 2023-09-01 VITALS — BP 119/77 | HR 66

## 2023-09-01 DIAGNOSIS — N411 Chronic prostatitis: Secondary | ICD-10-CM

## 2023-09-01 DIAGNOSIS — N400 Enlarged prostate without lower urinary tract symptoms: Secondary | ICD-10-CM | POA: Insufficient documentation

## 2023-09-01 DIAGNOSIS — R102 Pelvic and perineal pain: Secondary | ICD-10-CM

## 2023-09-01 DIAGNOSIS — N3020 Other chronic cystitis without hematuria: Secondary | ICD-10-CM | POA: Insufficient documentation

## 2023-09-01 DIAGNOSIS — R300 Dysuria: Secondary | ICD-10-CM

## 2023-09-01 LAB — BLADDER SCAN AMB NON-IMAGING: Scan Result: 4

## 2023-09-01 MED ORDER — TADALAFIL 5 MG PO TABS: 5.0000 mg | ORAL_TABLET | Freq: Every day | ORAL | 11 refills | Status: AC

## 2023-09-01 MED ORDER — AMITRIPTYLINE HCL 25 MG PO TABS
25.0000 mg | ORAL_TABLET | Freq: Every day | ORAL | 11 refills | Status: AC
Start: 2023-09-01 — End: ?

## 2023-09-01 NOTE — Progress Notes (Signed)
 Name: Luis Becker DOB: 1985-07-07 MRN: [id number]  Due to language barrier, an interpreter was present during the history-taking and subsequent discussion (and for part of the physical exam) with this patient. - InterpreterBETHA Shields [phone])  History of Present Illness: Luis Becker is a 38 y.o. male who presents today for follow up visit at [facility].  Relevant History includes: 1. BPH. 2. Chronic cystitis. 3. Chronic pelvic pain.  At 1st visit with Dr. Watt on 11/29/2020: - Seen for Prostatitis with perineal pain and dysuria. - Had tried multiple meds including tamsulosin and doxycyline. His UA today is clear. STD testing was negative. He is uncircumcised. He is currently finasteride and doxycycline. He has no frequency, nocturia or urgency. His IPSS is 3. PSA was 0.56.  - Denied any identified dietary irritants. - The plan was: I am going to have him stop the finasteride and doxycycline and will have him return for cystoscopy.  I will give him a trial of bactrim  in the interim.  - Patient was subsequently lost to follow up.  Recent history:  > 07/02/2023: Seen in ER for dysuria that has been ongoing and intermittent for approximate the past 6 years... He notes that he has been treated for prostatitis multiple times. Patient notes that he was recently placed on tadalafil  to help with his symptoms. He notes that he is also noted some burning around his rectum. - UA: negative - Urine culture: negative - Renal function normal (GFR >60; creatinine 0.76). CBC with no leukocytosis. CT showed subtle perivesical fat stranding mainly along the anterosuperior aspect, which is nonspecific. Findings can be seen with chronic versus acute cystitis. No GU stones, masses, or hydronephrosis; prostate unremarkable. Prescription given for Phenazopyridine  (Pyridium ) for PRN use.  Today: He reports chronic burning sensation which is between the bladder and the prostate, at  his rectum, and occasional mildly in the scrotum. Sometimes has pain with defecation. Reports mild dysuria. Denies urinary urgency, frequency, nocturia, gross hematuria, hesitancy, straining to void, or sensations of incomplete emptying. States his urinary stream is normal. States that he was prescribed Tadalafil  last year and that made his pain less intense. He states his pain is worse at times after he has been at work for many hours doing physical activities. The pain has at times caused erectile dysfunction.  Medications: Current Outpatient Medications  Medication Sig Dispense Refill   amitriptyline  (ELAVIL ) 25 MG tablet Take 1 tablet (25 mg total) by mouth at bedtime. 30 tablet 11   tadalafil  (CIALIS ) 5 MG tablet Take 1 tablet (5 mg total) by mouth daily. Take one 5 mg tablet once daily at approximately the same time each day without regard to timing of sexual activity. 30 tablet 11   cyclobenzaprine  (FLEXERIL ) 5 MG tablet Take 5 mg by mouth 3 (three) times daily as needed for muscle spasms. (Patient not taking: Reported on 09/01/2023)     ibuprofen  (ADVIL ) 800 MG tablet Take 800 mg by mouth every 8 (eight) hours as needed. (Patient not taking: Reported on 09/01/2023)     metoprolol  tartrate (LOPRESSOR ) 100 MG tablet Take 1 tablet (100 mg total) by mouth once for 1 dose. Take 1 tablet (100 mg total) two hours prior to CT scan. (Patient not taking: Reported on 09/01/2023) 1 tablet 0   phenazopyridine  (PYRIDIUM ) 200 MG tablet Take 1 tablet (200 mg total) by mouth 3 (three) times daily as needed for pain (burning with urination). (Patient not taking: Reported on 09/01/2023) 15 tablet 0  No current facility-administered medications for this visit.    Allergies: No Known Allergies  Past Medical History:  Diagnosis Date   Diabetes mellitus without complication (HCC)    per pt pre diabetic   History reviewed. No pertinent surgical history. Family History  Problem Relation Age of Onset   Asthma  Brother    Hyperlipidemia Brother    Social History   Socioeconomic History   Marital status: Single    Spouse name: Not on file   Number of children: Not on file   Years of education: Not on file   Highest education level: Not on file  Occupational History   Not on file  Tobacco Use   Smoking status: Former   Smokeless tobacco: Never  Vaping Use   Vaping status: Never Used  Substance and Sexual Activity   Alcohol use: No   Drug use: No   Sexual activity: Not on file  Other Topics Concern   Not on file  Social History Narrative   Not on file   Social Drivers of Health   Financial Resource Strain: Not on file  Food Insecurity: Not on file  Transportation Needs: Not on file  Physical Activity: Not on file  Stress: Not on file  Social Connections: Not on file  Intimate Partner Violence: Not on file    Review of Systems Constitutional: Patient denies any unintentional weight loss or change in strength lntegumentary: Patient denies any rashes or pruritus Cardiovascular: Patient denies chest pain or syncope Respiratory: Patient denies shortness of breath Gastrointestinal: Patient denies nausea, vomiting, constipation, or diarrhea  Musculoskeletal: Patient denies muscle cramps or weakness Neurologic: Patient denies convulsions or seizures Allergic/Immunologic: Patient denies recent allergic reaction(s) Hematologic/Lymphatic: Patient denies bleeding tendencies Endocrine: Patient denies heat/cold intolerance  GU: As per HPI.  OBJECTIVE Vitals:   09/01/23 1555  BP: 119/77  Pulse: 66   There is no height or weight on file to calculate BMI.  Physical Examination Constitutional: No obvious distress; patient is non-toxic appearing  Cardiovascular: No visible lower extremity edema.  Respiratory: The patient does not have audible wheezing/stridor; respirations do not appear labored  Gastrointestinal: Abdomen non-distended Musculoskeletal: Normal ROM of UEs  Skin: No  obvious rashes/open sores  Neurologic: CN 2-12 grossly intact Psychiatric: Answered questions appropriately with normal affect  Hematologic/Lymphatic/Immunologic: No obvious bruises or sites of spontaneous bleeding  - UA: trace blood - Urine microscopy: 0 WBC/hpf, 0-2 RBC/hpf, 0 bacteria  PVR: 4 ml  ASSESSMENT Chronic cystitis - Plan: Urinalysis, Routine w reflex microscopic, tadalafil  (CIALIS ) 5 MG tablet, amitriptyline  (ELAVIL ) 25 MG tablet, MR Pelvis W Wo Contrast  Dysuria  Benign prostatic hyperplasia, unspecified whether lower urinary tract symptoms present - Plan: BLADDER SCAN AMB NON-IMAGING, tadalafil  (CIALIS ) 5 MG tablet  Chronic prostatitis - Plan: amitriptyline  (ELAVIL ) 25 MG tablet, MR Pelvis W Wo Contrast  Pelvic pain in male - Plan: amitriptyline  (ELAVIL ) 25 MG tablet, MR Pelvis W Wo Contrast  We discussed possible etiologies including but not limited to: acute bacterial prostatitis, chronic bacterial or non-bacterial prostatitis, Chronic Prostatitis/Chronic Pelvic Pain Syndrome (CP/CPPS). - Discussed possible triggers for prostatitis including groin injury, catheterization, UTI, dehydration, and prolonged perineal pressure (such as bike riding). Advised pt to avoid alcohol, spicy foods, and acidic beverages as these may be inflammatory. - We discussed potential treatment options including but not limited to: alpha blockers, NSAIDs, Elmiron, Baclofen, neuromodulators, pelvic floor PT.  He elected to trial Tadalafil  and Amitriptyline  along with MRI for further evaluation.   We  agreed to plan for follow up in 4 weeks for recheck and to discuss MRI results. If symptoms persist may then consider Baclofen 10 mg nightly and every 8 hours PRN (max of 3 tablets total per 24 hour period) for pelvic floor muscle relaxation and/or cystoscopic evaluation.  Patient verbalized understanding of and agreement with current plan. All questions were answered.  PLAN Advised the  following: 1. Pelvic MRI. 2. Cialis  (Tadalafil ) 5 mg daily. 3. Amitriptyline  25 mg nightly. 4. Avoid dietary triggers. 5. Return in about 4 weeks (around 09/29/2023).  Orders Placed This Encounter  Procedures   MR Pelvis W Wo Contrast    Standing Status:   Future    Expiration Date:   08/31/2024    If indicated for the ordered procedure, I authorize the administration of contrast media per Radiology protocol:   Yes    What is the patient's sedation requirement?:   No Sedation    Does the patient have a pacemaker or implanted devices?:   No    Preferred imaging location?:   [facility] (table limit - 500lbs)   Urinalysis, Routine w reflex microscopic   BLADDER SCAN AMB NON-IMAGING   Total time spent caring for the patient today was over 45 minutes. This includes time spent on the date of the visit reviewing the patient's chart before the visit, time spent during the visit, and time spent after the visit on documentation. Over 50% of that time was spent in face-to-face time with this patient for direct counseling. E&M based on time and complexity of medical decision making.  It has been explained that the patient is to follow regularly with their PCP in addition to all other providers involved in their care and to follow instructions provided by these respective offices. Patient advised to contact urology clinic if any urologic-pertaining questions, concerns, new symptoms or problems arise in the interim period.  There are no Patient Instructions on file for this visit.  Electronically signed by:  Lauraine JAYSON Oz, FNP   09/01/23    4:46 PM

## 2023-09-02 LAB — MICROSCOPIC EXAMINATION
Bacteria, UA: NONE SEEN
Epithelial Cells (non renal): NONE SEEN /HPF (ref 0–10)
WBC, UA: NONE SEEN /HPF (ref 0–5)

## 2023-09-02 LAB — URINALYSIS, ROUTINE W REFLEX MICROSCOPIC
Bilirubin, UA: NEGATIVE
Glucose, UA: NEGATIVE
Ketones, UA: NEGATIVE
Leukocytes,UA: NEGATIVE
Nitrite, UA: NEGATIVE
Specific Gravity, UA: 1.03 (ref 1.005–1.030)
Urobilinogen, Ur: 1 mg/dL (ref 0.2–1.0)
pH, UA: 6 (ref 5.0–7.5)

## 2023-09-08 ENCOUNTER — Ambulatory Visit (HOSPITAL_COMMUNITY): Admission: RE | Admit: 2023-09-08 | Payer: Self-pay | Source: Ambulatory Visit

## 2023-10-01 ENCOUNTER — Ambulatory Visit: Payer: Self-pay | Admitting: Urology
